# Patient Record
Sex: Female | Born: 1985
Health system: Southern US, Community
[De-identification: ages and names within clinical notes are randomized; demographics above are authoritative.]

## PROBLEM LIST (undated history)

## (undated) DIAGNOSIS — F419 Anxiety disorder, unspecified: Secondary | ICD-10-CM

## (undated) DIAGNOSIS — J302 Other seasonal allergic rhinitis: Secondary | ICD-10-CM

## (undated) DIAGNOSIS — E559 Vitamin D deficiency, unspecified: Secondary | ICD-10-CM

## (undated) DIAGNOSIS — J45909 Unspecified asthma, uncomplicated: Secondary | ICD-10-CM

## (undated) DIAGNOSIS — R12 Heartburn: Secondary | ICD-10-CM

## (undated) DIAGNOSIS — R7303 Prediabetes: Secondary | ICD-10-CM

## (undated) DIAGNOSIS — R030 Elevated blood-pressure reading, without diagnosis of hypertension: Secondary | ICD-10-CM

## (undated) DIAGNOSIS — F32A Depression, unspecified: Secondary | ICD-10-CM

## (undated) HISTORY — DX: Depression, unspecified: F32.A

## (undated) HISTORY — DX: Elevated blood-pressure reading, without diagnosis of hypertension: R03.0

## (undated) HISTORY — DX: Prediabetes: R73.03

## (undated) HISTORY — DX: Other seasonal allergic rhinitis: J30.2

## (undated) HISTORY — PX: WISDOM TOOTH EXTRACTION: SHX21

## (undated) HISTORY — DX: Unspecified asthma, uncomplicated: J45.909

## (undated) HISTORY — DX: Heartburn: R12

## (undated) HISTORY — DX: Vitamin D deficiency, unspecified: E55.9

## (undated) HISTORY — DX: Anxiety disorder, unspecified: F41.9

---

## 2012-05-26 ENCOUNTER — Emergency Department (HOSPITAL_BASED_OUTPATIENT_CLINIC_OR_DEPARTMENT_OTHER)
Admission: EM | Admit: 2012-05-26 | Discharge: 2012-05-27 | Disposition: A | Discharge status: Home / Self Care | Payer: Commercial Managed Care - PPO | Attending: Emergency Medicine | Admitting: Emergency Medicine

## 2012-05-26 ENCOUNTER — Encounter (HOSPITAL_BASED_OUTPATIENT_CLINIC_OR_DEPARTMENT_OTHER): Payer: Self-pay | Admitting: *Deleted

## 2012-05-26 DIAGNOSIS — R000 Tachycardia, unspecified: Secondary | ICD-10-CM | POA: Insufficient documentation

## 2012-05-26 DIAGNOSIS — R42 Dizziness and giddiness: Secondary | ICD-10-CM | POA: Insufficient documentation

## 2012-05-26 DIAGNOSIS — Z3202 Encounter for pregnancy test, result negative: Secondary | ICD-10-CM | POA: Insufficient documentation

## 2012-05-26 DIAGNOSIS — R197 Diarrhea, unspecified: Secondary | ICD-10-CM | POA: Insufficient documentation

## 2012-05-26 DIAGNOSIS — R110 Nausea: Secondary | ICD-10-CM | POA: Insufficient documentation

## 2012-05-26 LAB — PREGNANCY, URINE: Preg Test, Ur: NEGATIVE

## 2012-05-26 LAB — URINALYSIS, ROUTINE W REFLEX MICROSCOPIC
Glucose, UA: NEGATIVE mg/dL
Ketones, ur: NEGATIVE mg/dL
Leukocytes, UA: NEGATIVE
pH: 6.5 (ref 5.0–8.0)

## 2012-05-26 MED ORDER — SODIUM CHLORIDE 0.9 % IV BOLUS (SEPSIS)
1000.0000 mL | Freq: Once | INTRAVENOUS | Status: AC
  Administered 2012-05-26: 1000 mL via INTRAVENOUS

## 2012-05-26 NOTE — ED Notes (Signed)
MD at bedside. 

## 2012-05-26 NOTE — ED Notes (Addendum)
Pt sts she has not felt well x3 weeks. She sts that [facility] dx her with an URI. Pt sts that she has had intermittent HA's and tonight she felt as if her heart was beating out of her chest. PTAR reports pt denied any new meds or stimulants.

## 2012-05-26 NOTE — ED Provider Notes (Signed)
History     CSN: [id number]  Arrival date & time 05/26/12  2208   First MD Initiated Contact with Patient 05/26/12 2300      Chief Complaint  Patient presents with  . Palpitations    (Consider location/radiation/quality/duration/timing/severity/associated sxs/prior treatment) HPI Melissa Mason is a 27 y.o. female was not been feeling right for the last 2-3 weeks. I week ago she went to urgent care and was diagnosed with a URI, was given no medication. She's had some upper respiratory symptoms, has not taken anything for it besides ibuprofen, no decongestants. Patient is not taking any other medicine. No other medical history. Today the patient started having a fast heart rate, she said she was dizzy which felt like "floating" not like passing out. Patient had nausea. She's felt a fluttering in her chest has had a lump in her throat but has not had any difficulty breathing, no wheezing, no stridor. No chest pain, no shortness of breath, no rash, no myalgias or arthralgias. No fevers or chills. Symptoms have been moderate to severe, constant, if not had any alleviating or exacerbating factors and no other associated symptoms.  History reviewed. No pertinent past medical history.  Past Surgical History  Procedure Laterality Date  . Wisdom tooth extraction      No family history on file.  History  Substance Use Topics  . Smoking status: Never Smoker   . Smokeless tobacco: Not on file  . Alcohol Use: No    OB History   Grav Para Term Preterm Abortions TAB SAB Ect Mult Living                  Review of Systems At least 10pt or greater review of systems completed and are negative except where specified in the HPI.  Allergies  Review of patient's allergies indicates no known allergies.  Home Medications  No current outpatient prescriptions on file.  BP 155/95  Pulse 125  Temp(Src) 97.9 F (36.6 C) (Oral)  Resp 20  Ht 5\' 1"  (1.549 m)  Wt 220 lb (99.791 kg)  BMI 41.59  kg/m2  SpO2 100%  LMP 05/05/2012  Physical Exam  Nursing notes reviewed.  Electronic medical record reviewed. VITAL SIGNS:   Filed Vitals:   05/26/12 2218 05/26/12 2220 05/27/12 0001  BP:  155/95   Pulse:  125 91  Temp:  97.9 F (36.6 C)   TempSrc:  Oral   Resp:  20   Height:  5\' 1"  (1.549 m)   Weight:  220 lb (99.791 kg)   SpO2: 99% 100% 100%   CONSTITUTIONAL: Awake, oriented, appears non-toxic HENT: Atraumatic, normocephalic, oral mucosa pink and moist, airway patent. Nares patent without drainage. External ears normal. EYES: Conjunctiva clear, EOMI, PERRLA NECK: Trachea midline, non-tender, supple CARDIOVASCULAR: Normal heart rate, Normal rhythm, 2/6 systolic murmur left sternal border PULMONARY/CHEST: Clear to auscultation, no rhonchi, wheezes, or rales. Symmetrical breath sounds. Non-tender. ABDOMINAL: Non-distended, soft, non-tender - no rebound or guarding.  BS normal. NEUROLOGIC: Non-focal, moving all four extremities, no gross sensory or motor deficits. EXTREMITIES: No clubbing, cyanosis, or edema SKIN: Warm, Dry, No erythema, No rash  ED Course  Procedures (including critical care time)  Date: 05/27/2012  Rate: 117  Rhythm: Sinus tachycardia  QRS Axis: normal  Intervals: normal  ST/T Wave abnormalities: normal  Conduction Disutrbances: none  Narrative Interpretation: Sinus tachycardia - no prior EKG for comparison   Labs Reviewed  BASIC METABOLIC PANEL - Abnormal; Notable for the following:  Potassium 3.3 (*)    Glucose, Bld 112 (*)    All other components within normal limits  URINALYSIS, ROUTINE W REFLEX MICROSCOPIC  PREGNANCY, URINE  MAGNESIUM   No results found.   1. Tachycardia   2. Dizziness   3. Nausea   4. Diarrhea       MDM  Melissa Mason is a 27 y.o. female is of rapid heart rate, not feeling right x3 weeks, upper respiratory symptoms, diarrhea x2 episodes earlier today. Patient is in sinus tach, do not think this is SVT, will not  give adenosine, we'll give trial of normal saline IV.  Patient is not pregnant, electrolyte unremarkable.  After 2 L of normal saline, patient's heart rate is down in the 80s, regular rate and rhythm.  Patient does have a faint murmur at the left sternal border, however EKG shows no sign of HOCM, there are no deep, narrow Q waves in the lateral leads V5 to V6 1 and aVL or inferior leads 23 aVF, there are no large T-wave inversions in the lateral leads either.  Patient has no history of syncope on exertion. Based on the clinical presentation and response to fluids, and she is mildly dehydrated, she's had some diarrhea, she's also felt nauseous in the emergency department, given her some Zofran which she's felt much better with. It is possible she is on the cusp of developing a viral gastroenteritis, give her some Zofran for nausea treatment encouraged by mouth fluids.  She can follow up with primary care physician next week. Return to the ER for any worsening or concerning symptoms. Patient understands accepts medical plan as it's been dictated and her questions have been answered.          Jones Skene, MD 05/27/12 [phone]

## 2012-05-27 ENCOUNTER — Emergency Department (HOSPITAL_BASED_OUTPATIENT_CLINIC_OR_DEPARTMENT_OTHER): Payer: Commercial Managed Care - PPO

## 2012-05-27 ENCOUNTER — Encounter (HOSPITAL_BASED_OUTPATIENT_CLINIC_OR_DEPARTMENT_OTHER): Payer: Self-pay | Admitting: *Deleted

## 2012-05-27 ENCOUNTER — Emergency Department (HOSPITAL_BASED_OUTPATIENT_CLINIC_OR_DEPARTMENT_OTHER)
Admission: EM | Admit: 2012-05-27 | Discharge: 2012-05-28 | Disposition: A | Discharge status: Home / Self Care | Payer: Commercial Managed Care - PPO | Attending: Emergency Medicine | Admitting: Emergency Medicine

## 2012-05-27 DIAGNOSIS — E860 Dehydration: Secondary | ICD-10-CM | POA: Insufficient documentation

## 2012-05-27 DIAGNOSIS — Z3202 Encounter for pregnancy test, result negative: Secondary | ICD-10-CM | POA: Insufficient documentation

## 2012-05-27 DIAGNOSIS — R197 Diarrhea, unspecified: Secondary | ICD-10-CM | POA: Insufficient documentation

## 2012-05-27 DIAGNOSIS — R110 Nausea: Secondary | ICD-10-CM

## 2012-05-27 DIAGNOSIS — J45909 Unspecified asthma, uncomplicated: Secondary | ICD-10-CM | POA: Insufficient documentation

## 2012-05-27 HISTORY — DX: Unspecified asthma, uncomplicated: J45.909

## 2012-05-27 LAB — BASIC METABOLIC PANEL
BUN: 17 mg/dL (ref 6–23)
Chloride: 100 mEq/L (ref 96–112)
GFR calc Af Amer: >90 mL/min (ref >90)
Potassium: 3.3 mEq/L — ABNORMAL LOW (ref 3.5–5.1)
Sodium: 138 mEq/L (ref 135–145)

## 2012-05-27 LAB — MAGNESIUM: Magnesium: 2.1 mg/dL (ref 1.5–2.5)

## 2012-05-27 LAB — PREGNANCY, URINE: Preg Test, Ur: NEGATIVE

## 2012-05-27 MED ORDER — ONDANSETRON HCL 4 MG/2ML IJ SOLN
INTRAMUSCULAR | Status: AC
  Filled 2012-05-27: qty 2

## 2012-05-27 MED ORDER — ONDANSETRON 4 MG PO TBDP: 4.0000 mg | ORAL_TABLET | Freq: Three times a day (TID) | ORAL | Status: DC | PRN

## 2012-05-27 MED ORDER — ONDANSETRON 8 MG PO TBDP
ORAL_TABLET | ORAL | Status: AC
  Administered 2012-05-27: 8 mg via ORAL
  Filled 2012-05-27: qty 1

## 2012-05-27 MED ORDER — ONDANSETRON 8 MG PO TBDP: 8.0000 mg | ORAL_TABLET | Freq: Once | ORAL | Status: AC

## 2012-05-27 MED ORDER — ONDANSETRON HCL 4 MG/2ML IJ SOLN
4.0000 mg | Freq: Once | INTRAMUSCULAR | Status: AC
  Administered 2012-05-27: 4 mg via INTRAVENOUS

## 2012-05-27 MED ORDER — SODIUM CHLORIDE 0.9 % IV BOLUS (SEPSIS)
1000.0000 mL | Freq: Once | INTRAVENOUS | Status: AC
  Administered 2012-05-27: 1000 mL via INTRAVENOUS

## 2012-05-27 NOTE — ED Notes (Signed)
Pt reports she was seen here last and dx with dehydration- tonight feels shaky and nauseated

## 2012-05-27 NOTE — ED Notes (Signed)
MD at bedside. 

## 2012-05-28 ENCOUNTER — Encounter (HOSPITAL_BASED_OUTPATIENT_CLINIC_OR_DEPARTMENT_OTHER): Payer: Self-pay | Admitting: Emergency Medicine

## 2012-05-28 MED ORDER — ONDANSETRON 8 MG PO TBDP: ORAL_TABLET | ORAL | Status: DC

## 2012-05-28 NOTE — ED Provider Notes (Signed)
History     CSN: [id number]  Arrival date & time 05/27/12  2212   First MD Initiated Contact with Patient 05/27/12 2317      Chief Complaint  Patient presents with  . Shaking    (Consider location/radiation/quality/duration/timing/severity/associated sxs/prior treatment) The history is provided by the patient. No language interpreter was used.  Patient seen within 24 hours for nausea and diarrhea and dehydration.  No emesis today some nausea following eating a healthy choice dinner. 2 episodes of diarrhea. But felt tremulous and came to the ED for further eval.  No fevers, no cough, no abdominal pain.  No sore throat.  No urinary symptoms.  Is holding down liquids.    Past Medical History  Diagnosis Date  . Asthma     Past Surgical History  Procedure Laterality Date  . Wisdom tooth extraction      No family history on file.  History  Substance Use Topics  . Smoking status: Never Smoker   . Smokeless tobacco: Never Used  . Alcohol Use: Yes     Comment: rare    OB History   Grav Para Term Preterm Abortions TAB SAB Ect Mult Living                  Review of Systems  Constitutional: Negative for fever.  HENT: Negative for sore throat.   Respiratory: Negative for cough.   Gastrointestinal: Positive for diarrhea. Negative for vomiting and abdominal pain.  Genitourinary: Negative for dysuria and flank pain.  All other systems reviewed and are negative.    Allergies  Review of patient's allergies indicates no known allergies.  Home Medications   Current Outpatient Rx  Name  Route  Sig  Dispense  Refill  . ondansetron (ZOFRAN ODT) 4 MG disintegrating tablet   Oral   Take 1 tablet (4 mg total) by mouth every 8 (eight) hours as needed for nausea.   10 tablet   0     BP 138/94  Pulse 92  Temp(Src) 98 F (36.7 C) (Oral)  Resp 20  Ht 5\' 1"  (1.549 m)  Wt 220 lb (99.791 kg)  BMI 41.59 kg/m2  SpO2 100%  LMP 05/05/2012  Physical Exam  Constitutional: She  is oriented to person, place, and time. She appears well-developed and well-nourished. No distress.  HENT:  Head: Normocephalic and atraumatic.  Mouth/Throat: Oropharynx is clear and moist.  Eyes: Conjunctivae are normal. Pupils are equal, round, and reactive to light.  Neck: Normal range of motion. Neck supple.  Cardiovascular: Normal rate, regular rhythm and intact distal pulses.   Pulmonary/Chest: Effort normal and breath sounds normal. She has no wheezes. She has no rales.  Abdominal: Soft. Bowel sounds are normal. There is no tenderness. There is no rebound and no guarding.  Musculoskeletal: Normal range of motion.  Lymphadenopathy:    She has no cervical adenopathy.  Neurological: She is alert and oriented to person, place, and time.  Skin: Skin is warm and dry.  Psychiatric: Thought content normal.    ED Course  Procedures (including critical care time)  Labs Reviewed  PREGNANCY, URINE   No results found.   No diagnosis found.    MDM  Suspect anxiety and nausea secondary to eating a healthy choice dinner.  PO challenged successfully in the ED. No indication for labs.  Imaging exam and vitals reassuring.  Follow up with your family doctor for ongoing care,  Parke Simmers diet instructions given  Jasmine Awe, MD 05/28/12 [phone]

## 2013-06-17 ENCOUNTER — Emergency Department (HOSPITAL_BASED_OUTPATIENT_CLINIC_OR_DEPARTMENT_OTHER)
Admission: EM | Admit: 2013-06-17 | Discharge: 2013-06-18 | Disposition: A | Discharge status: Home / Self Care | Payer: Commercial Managed Care - PPO | Attending: Emergency Medicine | Admitting: Emergency Medicine

## 2013-06-17 ENCOUNTER — Encounter (HOSPITAL_BASED_OUTPATIENT_CLINIC_OR_DEPARTMENT_OTHER): Payer: Self-pay | Admitting: Emergency Medicine

## 2013-06-17 DIAGNOSIS — F410 Panic disorder [episodic paroxysmal anxiety] without agoraphobia: Secondary | ICD-10-CM | POA: Insufficient documentation

## 2013-06-17 DIAGNOSIS — R002 Palpitations: Secondary | ICD-10-CM | POA: Insufficient documentation

## 2013-06-17 DIAGNOSIS — J45909 Unspecified asthma, uncomplicated: Secondary | ICD-10-CM | POA: Insufficient documentation

## 2013-06-17 NOTE — ED Notes (Signed)
States she has been feeling anxious and heart racing x 2 hours

## 2013-06-17 NOTE — ED Provider Notes (Signed)
CSN: [id number]     Arrival date & time 06/17/13  2252 History  This chart was scribed for Yaris Ferrell Alfonso Patten, MD by Roe Coombs, ED Scribe. The patient was seen in room [place]. Patient's care was started at 11:57 PM.  Chief Complaint  Patient presents with  . Panic Attack     Patient is a 28 y.o. female presenting with anxiety. The history is provided by the patient. No language interpreter was used.  Anxiety This is a recurrent problem. The current episode started 1 to 2 hours ago. The problem occurs constantly. The problem has been rapidly improving. Pertinent negatives include no chest pain, no abdominal pain, no headaches and no shortness of breath. Nothing aggravates the symptoms. Nothing relieves the symptoms. She has tried nothing for the symptoms. The treatment provided significant relief.    HPI Comments: Melissa Mason is a 28 y.o. female with history of anxiety who presents to the Emergency Department complaining of a panic attack that has rapidly improved since onset. Patient states that 2 hours ago, she began to feel anxious and developed palpitations. She says that she was not doing any stressful activities prior to episode onset. Patient further reports that she has been working her PCP to identify causes or triggers of these episodes. She has also had increased blood pressure with her panic episodes so she is currently on a low dose of metoprolol. She has not seen a therapist for this problem. She denies any other symptoms at this time. She has a medical history of asthma.   Past Medical History  Diagnosis Date  . Asthma    Past Surgical History  Procedure Laterality Date  . Wisdom tooth extraction     No family history on file. History  Substance Use Topics  . Smoking status: Never Smoker   . Smokeless tobacco: Never Used  . Alcohol Use: Yes     Comment: rare   OB History   Grav Para Term Preterm Abortions TAB SAB Ect Mult Living                 Review of  Systems  Constitutional: Negative for fever.  Respiratory: Negative for shortness of breath.   Cardiovascular: Positive for palpitations. Negative for chest pain.  Gastrointestinal: Negative for abdominal pain.  Neurological: Negative for headaches.  All other systems reviewed and are negative.   Allergies  Review of patient's allergies indicates no known allergies.  Home Medications   Current Outpatient Rx  Name  Route  Sig  Dispense  Refill  . ALPRAZolam (XANAX) 0.25 MG tablet   Oral   Take 0.25 mg by mouth at bedtime as needed for anxiety.         . metoprolol tartrate (LOPRESSOR) 25 MG tablet   Oral   Take 25 mg by mouth at bedtime as needed.         . ondansetron (ZOFRAN ODT) 4 MG disintegrating tablet   Oral   Take 1 tablet (4 mg total) by mouth every 8 (eight) hours as needed for nausea.   10 tablet   0   . ondansetron (ZOFRAN ODT) 8 MG disintegrating tablet      8mg  ODT q8 hours prn nausea   4 tablet   0    Triage Vitals: BP 142/84  Pulse 97  Temp(Src) 98.3 F (36.8 C) (Oral)  Resp 20  Ht 5\' 1"  (1.549 m)  Wt 225 lb (102.059 kg)  BMI 42.54 kg/m2  SpO2 100%  LMP 05/27/2013 Physical Exam  Constitutional: She is oriented to person, place, and time. She appears well-developed and well-nourished. No distress.  HENT:  Head: Normocephalic and atraumatic.  Mouth/Throat: Oropharynx is clear and moist. No oropharyngeal exudate.  Moist mucous membranes.  Eyes: EOM are normal. Pupils are equal, round, and reactive to light.  Neck: Normal range of motion. Neck supple. No tracheal deviation present.  Cardiovascular: Normal rate, regular rhythm and normal heart sounds.  Exam reveals no gallop and no friction rub.   No murmur heard. Pulmonary/Chest: Effort normal and breath sounds normal. No respiratory distress. She has no wheezes. She has no rales.  Abdominal: Soft. Bowel sounds are normal. There is no tenderness. There is no rebound and no guarding.   Musculoskeletal: Normal range of motion.  Neurological: She is alert and oriented to person, place, and time. She has normal reflexes. She displays normal reflexes.  Skin: Skin is warm and dry.  Psychiatric: Her mood appears anxious.    ED Course  Procedures (including critical care time) DIAGNOSTIC STUDIES: Oxygen Saturation is 100% on room air, normal by my interpretation.    COORDINATION OF CARE: 12:02 AM- Recommended that patient continue to follow up with her PCP and seek referral for counseling to identify cause of panic episodes. Patient informed of current plan for treatment and evaluation and agrees with plan at this time.    MDM   Final diagnoses:  None   Feels improved and does not want meds at this time.  Will give outpatient resources for therapy.    I personally performed the services described in this documentation, which was scribed in my presence. The recorded information has been reviewed and is accurate.     Carlisle Beers, MD 06/18/13 [phone]

## 2013-06-18 ENCOUNTER — Encounter (HOSPITAL_BASED_OUTPATIENT_CLINIC_OR_DEPARTMENT_OTHER): Payer: Self-pay | Admitting: Emergency Medicine

## 2013-06-18 NOTE — Discharge Instructions (Signed)
Panic Attacks Panic attacks are sudden, short-livedsurges of severe anxiety, fear, or discomfort. They [place] occur for no reason when you are relaxed, when you are anxious, or when you are sleeping. Panic attacks Salomon occur for a number of reasons:   Healthy people occasionally have panic attacks in extreme, life-threatening situations, such as war or natural disasters. Normal anxiety is a protective mechanism of the body that helps [place] react to danger (fight or flight response).  Panic attacks are often seen with anxiety disorders, such as panic disorder, social anxiety disorder, generalized anxiety disorder, and phobias. Anxiety disorders cause excessive or uncontrollable anxiety. They [place] interfere with your relationships or other life activities.  Panic attacks are sometimes seen with other mental illnesses such as depression and posttraumatic stress disorder.  Certain medical conditions, prescription medicines, and drugs of abuse can cause panic attacks. SYMPTOMS  Panic attacks start suddenly, peak within 20 minutes, and are accompanied by four or more of the following symptoms:  Pounding heart or fast heart rate (palpitations).  Sweating.  Trembling or shaking.  Shortness of breath or feeling smothered.  Feeling choked.  Chest pain or discomfort.  Nausea or strange feeling in your stomach.  Dizziness, lightheadedness, or feeling like you will faint.  Chills or hot flushes.  Numbness or tingling in your lips or hands and feet.  Feeling that things are not real or feeling that you are not yourself.  Fear of losing control or going crazy.  Fear of dying. Some of these symptoms can mimic serious medical conditions. For example, you Cortez think you are having a heart attack. Although panic attacks can be very scary, they are not life threatening. DIAGNOSIS  Panic attacks are diagnosed through an assessment by your health care provider. Your health care provider will ask questions  about your symptoms, such as where and when they occurred. Your health care provider will also ask about your medical history and use of alcohol and drugs, including prescription medicines. Your health care provider Dedrick order blood tests or other studies to rule out a serious medical condition. Your health care provider Allard refer you to a mental health professional for further evaluation. TREATMENT   Most healthy people who have one or two panic attacks in an extreme, life-threatening situation will not require treatment.  The treatment for panic attacks associated with anxiety disorders or other mental illness typically involves counseling with a mental health professional, medicine, or a combination of both. Your health care provider will help determine what treatment is best for you.  Panic attacks due to physical illness usually goes away with treatment of the illness. If prescription medicine is causing panic attacks, talk with your health care provider about stopping the medicine, decreasing the dose, or substituting another medicine.  Panic attacks due to alcohol or drug abuse goes away with abstinence. Some adults need professional help in order to stop drinking or using drugs. HOME CARE INSTRUCTIONS   Take all your medicines as prescribed.   Check with your health care provider before starting new prescription or over-the-counter medicines.  Keep all follow up appointments with your health care provider. SEEK MEDICAL CARE IF:  You are not able to take your medicines as prescribed.  Your symptoms do not improve or get worse. SEEK IMMEDIATE MEDICAL CARE IF:   You experience panic attack symptoms that are different than your usual symptoms.  You have serious thoughts about hurting yourself or others.  You are taking medicine for panic attacks and  have a serious side effect. MAKE SURE YOU:  Understand these instructions.  Will watch your condition.  Will get help right away  if you are not doing well or get worse. Document Released: 02/23/2005 Document Revised: 12/14/2012 Document Reviewed: 10/07/2012 [facility] Patient Information 2014 [place].  Emergency Department Resource Guide 1) Find a Doctor and Pay Out of Pocket Although you won't have to find out who is covered by your insurance plan, it is a good idea to ask around and get recommendations. You will then need to call the office and see if the doctor you have chosen will accept you as a new patient and what types of options they offer for patients who are self-pay. Some doctors offer discounts or will set up payment plans for their patients who do not have insurance, but you will need to ask so you aren't surprised when you get to your appointment.  2) Contact Your Local Health Department Not all health departments have doctors that can see patients for sick visits, but many do, so it is worth a call to see if yours does. If you don't know where your local health department is, you can check in your phone book. The CDC also has a tool to help you locate your state's health department, and many state websites also have listings of all of their local health departments.  3) Find a [facility] If your illness is not likely to be very severe or complicated, you may want to try a walk in clinic. These are popping up all over the country in pharmacies, drugstores, and shopping centers. They're usually staffed by nurse practitioners or physician assistants that have been trained to treat common illnesses and complaints. They're usually fairly quick and inexpensive. However, if you have serious medical issues or chronic medical problems, these are probably not your best option.  No Primary Care Doctor: - Call Health Connect at  [phone] - they can help you locate a primary care doctor that  accepts your insurance, provides certain services, etc. - Physician Referral Service- [phone]  Chronic Pain  Problems: Organization         Address  Phone   Notes  [facility]  [phone] Patients need to be referred by their primary care doctor.   Medication Assistance: Organization         Address  Phone   Notes  [facility] Medication [facility] [address] [place], [place] [address] [phone] --Must be a resident of [facility] -- Must have NO insurance coverage whatsoever (no Medicaid/ Medicare, etc.) -- The pt. MUST have a primary care doctor that directs their care regularly and follows them in the community   MedAssist  [phone]   [employer]  [phone]    Agencies that provide inexpensive medical care: Organization         Address  Phone   Notes  [place]  [phone]   [facility]    [phone]   [facility] [place], [place] [address] [phone]   [place] [address] [place] [phone]   Planned Parenthood    [phone]   [facility]    [phone]   [place] and [address], [place] Phone:  [phone], Fax:  [phone] Hours of Operation:  9 am - 6  pm, M-F.  Also accepts Medicaid/Medicare and self-pay.  [facility] for [address]ola [address], [address], [place] Phone: [phone], Fax: [phone]. Hours of Operation:  8:30 am - 5:30 pm, M-F.  Also accepts Medicaid and self-pay.  [place] [address], [place] Phone: [phone]   [place], [place], [place] [phone], Ext. 123 Mondays & Thursdays: 7-9 AM.  First 15 patients are seen on a first come, first serve basis.    [place]  Providers:  Organization         Address  Phone   Notes  [facility] [address], [place] [phone] Also  accepts self-pay patients.  [facility] [address], [place]  [phone]   [place], [address], [place] [phone]   [facility] [address] [place] [phone]   Lucianne Lei [address], [place]   [phone] Only accepts [place] Access [place] patients after they have their name applied to their card.   Self-Pay (no insurance) in [facility]:  Organization         Address  Phone   Notes  Sickle Cell Patients, [facility] [place] [phone]   [facility] [place] [phone]   [facility] [place]  [place], [place], [place] [phone]   [facility]/Dr. Osei-Bonsu  [address] [place] or [address], [place] [phone] Phone number for both [place] and [place] locations is the same.  Urgent Medical and [facility] [address] [place] [phone]   [facility] [address], [place] or [address] [phone]   [facility] [address], [place] [phone], phone; [phone], fax Sees patients 1st and 3rd Saturday of every month.  Must not qualify for public or private insurance (i.e. Medicaid, Medicare, [place] Health Choice, Veterans' Benefits)  Household income should be no more than 200% of the poverty level The clinic cannot treat you if you are pregnant or think you are pregnant  Sexually transmitted diseases are not treated at the clinic.    Dental Care: Organization         Address  Phone  Notes  [facility] of [facility] [place] [phone] Accepts children up to age 26 who are enrolled in [place] or [place]; pregnant  women with a Medicaid card; and children who have applied for Medicaid or [place] Health Choice, but were declined, whose parents can pay a reduced fee at time of service.  [facility] Department of [facility]  [address], [place] [phone] Accepts children up to age 35 who are enrolled in [place] or [place]; pregnant women with a Medicaid card; and children who have applied for Medicaid or [place] Health Choice, but were declined, whose parents can pay a reduced fee at time of service.  [place] Adult Dental Access PROGRAM  [place] [phone] Patients are seen by appointment only. Walk-ins are not accepted. [place] will see patients 48 years of age and older.  Monday - Tuesday (8am-5pm) Most Wednesdays (8:30-5pm) $30 per visit, cash only  [facility] Adult Dental Access PROGRAM  [address], [facility] [phone] Patients are seen by appointment only. Walk-ins are not accepted. [facility] will see patients 69 years of age and older. One Wednesday Evening (Monthly: Volunteer Based).  $30 per visit, cash only  [employer]  [phone] for adults; Children under age 68, call Graduate Pediatric Dentistry at [phone]. Children aged 25-14, please call [phone] to request a pediatric application.  Dental services are provided in all areas of dental care including fillings, crowns and bridges, complete and partial dentures, implants, gum treatment, root canals, and extractions. Preventive care is also provided. Treatment is provided to both adults and children. Patients are selected via a lottery and there is often a waiting list.   [facility] [address] [place]  [phone] [email or web address]   [facility] [address] [place], [place] [phone], Ext. 123 Second and Fourth Thursday of each month, opens at 6:30 AM; Clinic ends at 9 AM.  Patients are  seen on a first-come first-served basis, and a limited number are seen during each clinic.   [facility]  [address] Ether Griffins [place], [place] [phone]   Eligibility Requirements You must have lived in [place], [place], or [place] counties for at least the last three months.   You cannot be eligible for state or federal sponsored [place], including [employer], [place], or [employer].   You generally cannot be eligible for healthcare insurance through your employer.    How to apply: Eligibility screenings are held every Tuesday and Wednesday afternoon from 1:00 pm until 4:00 pm. You do not need an appointment for the interview!  [facility] [address], [place], [place] [phone]   [facility] Health Department  [phone]   [facility] Health Department  [phone]   [facility] Health Department  [phone]    Behavioral Health Resources in the Community: Intensive Outpatient Programs Organization         Address  Phone  Notes  [facility] [address] [place], [place] [phone]   [facility] Outpatient [address] [place], [place] [phone]   ADS: Alcohol & Drug Svcs [address] [place], [place]  [phone]   [facility] Mental Health [address]  [place], [place] [phone] or [phone]   Substance Abuse Resources Organization         Address  Phone  Notes  Alcohol and Drug Services  [phone]   [facility]  [phone]   [facility] [place]  [phone]   Floydene Flock  [phone]   Residential & Outpatient Substance Abuse Program  ([phone]   Psychological Services Organization         Address  Phone  Notes  [facility] Behavioral Health  [phone]   [facility] Services  [phone]   [facility] Mental Health [address], [place] [phone] or [phone]    Mobile Crisis  Teams Organization         Address  Phone  Notes  [facility]atives, Mobile Crisis Care Unit  [phone]   [facility]  [address] [place], [place] [phone]   Doristine Locks [address] [place] [phone]    Self-Help/Support Groups Organization         Address  Phone  Notes  Mental Health Assoc. of [place] - variety of support groups  [phone] Call for more information  Narcotics Anonymous (NA), Caring Services [address], [place]  2 meetings at this location   Statisticianesidential Treatment Programs Organization         Address  Phone  Notes  [facility] [address],    [place]  [phone]   [facility]  [address], [place] [phone], [place], [place] [phone]   [facility] [address], [place] [phone] Admissions: 8am-3pm M-F  Incentives Substance Abuse Treatment Center [address]    [place], [place] [phone]   [facility] [address], [place], [place] [phone]   [facility] [address]  [place], [place] [phone]   [facility] - Intensive Outpatient [address], [place], [place] [phone]   [facility] (Addiction Recovery Care Assoc.) [address]  [place], [place] [phone] or [phone]   Residential Treatment Services (RTS) [address] [place], [place] [phone] Accepts Medicaid  [facility] [place] [address]  [place] [phone] Substance Abuse/Addiction Treatment   [facility] Behavioral Health Resources Organization         Address  Phone  Notes  [facility]  [phone]) [phone]   Angie FavaJulie Brannon, PhD [address], [address] [place], [place]   [phone](336) [phone] or [phone](336) [phone]   [facility] [place]   [address] [place], [place] [phone]) [phone]   [facility] [address], [place], [place] [phone](336) [phone]  Insurance/Medicaid/sponsorship through [facility]  Faith and Families [address]                                    [place], [place] [phone]) [phone] Therapy/tele-psych/case  [address] [facility] [address]   [place], [place] [phone]) [phone]    Dr. Lolly MustacheArfeen  [phone](336) [phone]   [facility] [place]  [employer] [facility] 1) [address], [place] 2) [address], [place] 3)  [address], [place] [phone]) [phone](336) [phone]  [phone]) [phone]   [facility] Child Abuse Hotline (617)[phone](336) (601) [phone] or [phone](336) [phone] (After Hours)     Behavioral Health Resources in the [facility]  Intensive Outpatient Programs: [facility]      [address] [place], [place] [phone] Both a day and evening program       [facility] Outpatient     [address]        [place], [place] [phone] [phone]         ADS: Alcohol & Drug Svcs [address] [place] [phone]  [facility] Mental [facility] ACCESS LINE: [phone] or [phone] [address] [place], [place] [phone] [email or web address]  Mobile Crisis Teams:                                        [facility]rapeutic Alternatives         Mobile Crisis Care Unit [phone]             [facility] Psych[facility] Services [address] [place] [phone]  [facility] [address] [place] [phone]  Self-Help/Support Groups: Mental Health Assoc. of [employer] of support groups [phone] (call for more info)  Narcotics Anonymous (NA) Caring Services [address] [place] [address] [place] - 2 meetings at this location  Residential Treatment Programs:  [place]       [place] [address] [place], [place]  [address] [place]  [address] [place], [place] [address] [phone] Admissions: 8am-3pm M-F  Incentives Substance [place]     [address], [place] [address]       [phone]         [facility] [address] Jadene Pierini [place], [place]  [facility] [address] [place], [place]  [facility]ight Programs - Intensive Outpatient      [address] Mabel 403     [place], [place]         [facility] ([place]     [place], [place] or [phone]  Residential Treatment Services (RTS)  [place], [place]  Fellowship [address]                                               [place]  [facility] Resources: St. [phone]               General Therapy                                                Domenic Schwab, PhD        [address] [place], [place] [address]         [facility]   [address] [place], [place] [address] [phone]  [facility] [address] [place], [place] [address] [phone] Insurance/Medicaid/sponsorship through [facility] and Families                                              Martell                                        [place], [place] [address]    Therapy/tele-psych/case         [place], [place]  [phone]  Adolescent/group home/case management  Hyndman PhD       General therapy       Insurance   [phone]         Dr. Adele Schilder Insurance [phone] M-F  [place] Detox/Residential Medicaid, sponsorship [phone]

## 2013-06-18 NOTE — ED Notes (Addendum)
Pt d/c home with friends- no Rx given- pt states sx have resolved

## 2013-08-07 IMAGING — CR DG ABDOMEN ACUTE W/ 1V CHEST
3 series · 3 of 3 positions shown · non-contrast
Comparison: None

CLINICAL DATA: Abdominal pain, nausea and diarrhea.

ACUTE ABDOMEN SERIES (ABDOMEN 2 VIEW & CHEST 1 VIEW)

[w chest pa]
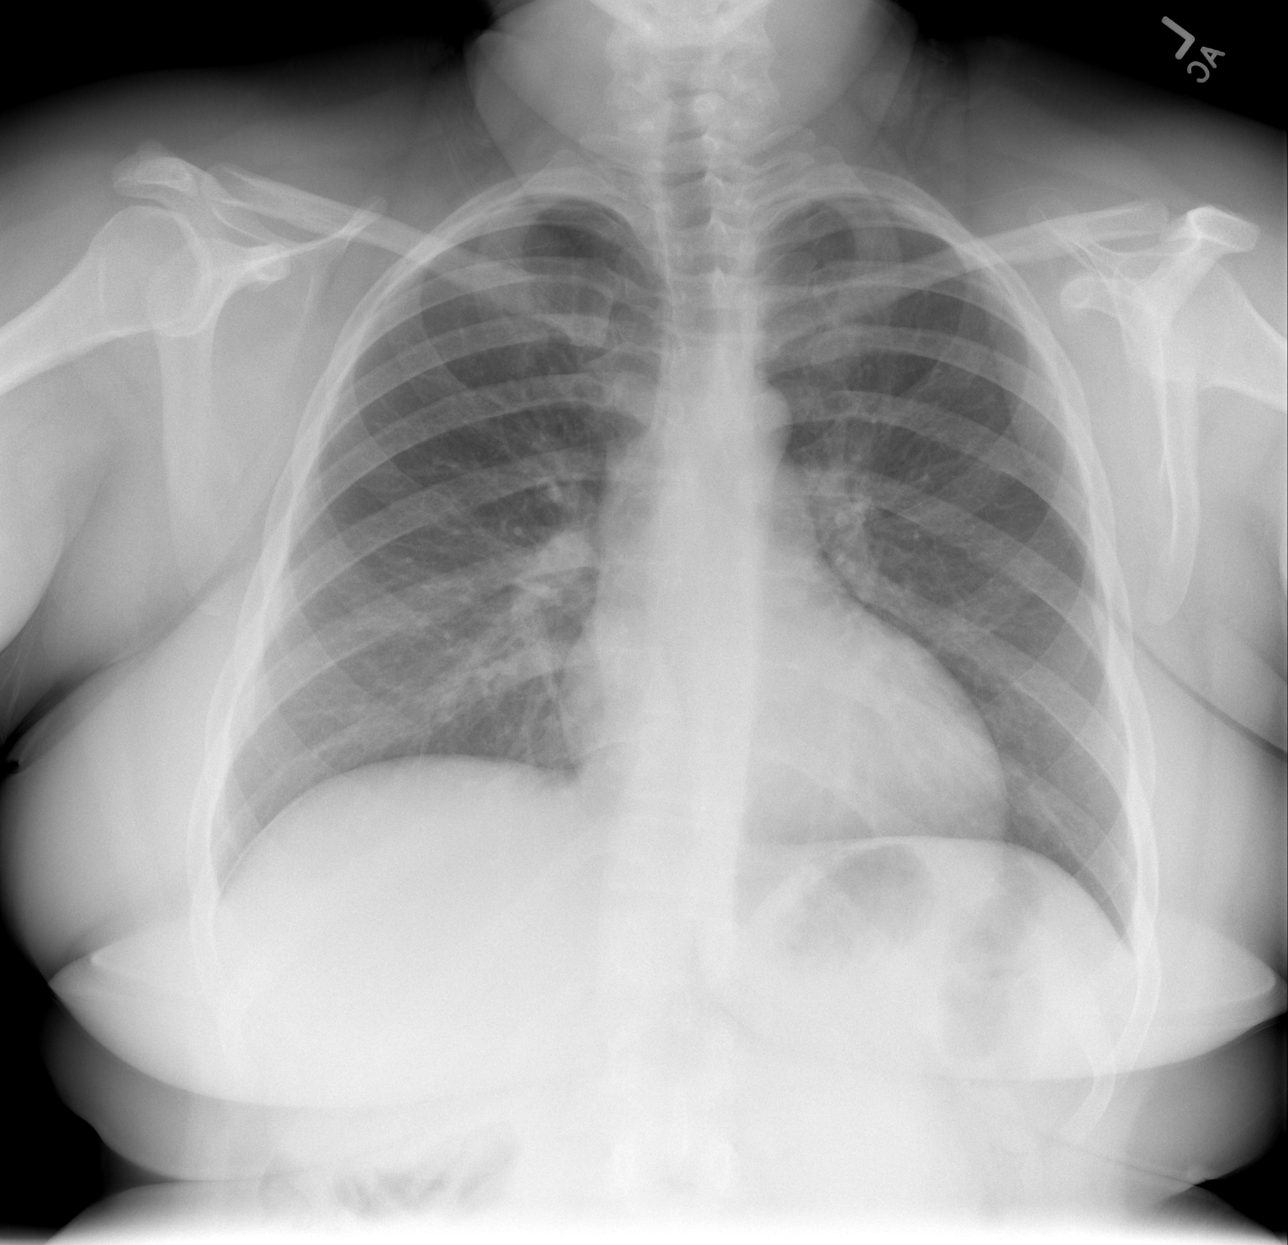

[w abdomen upright]
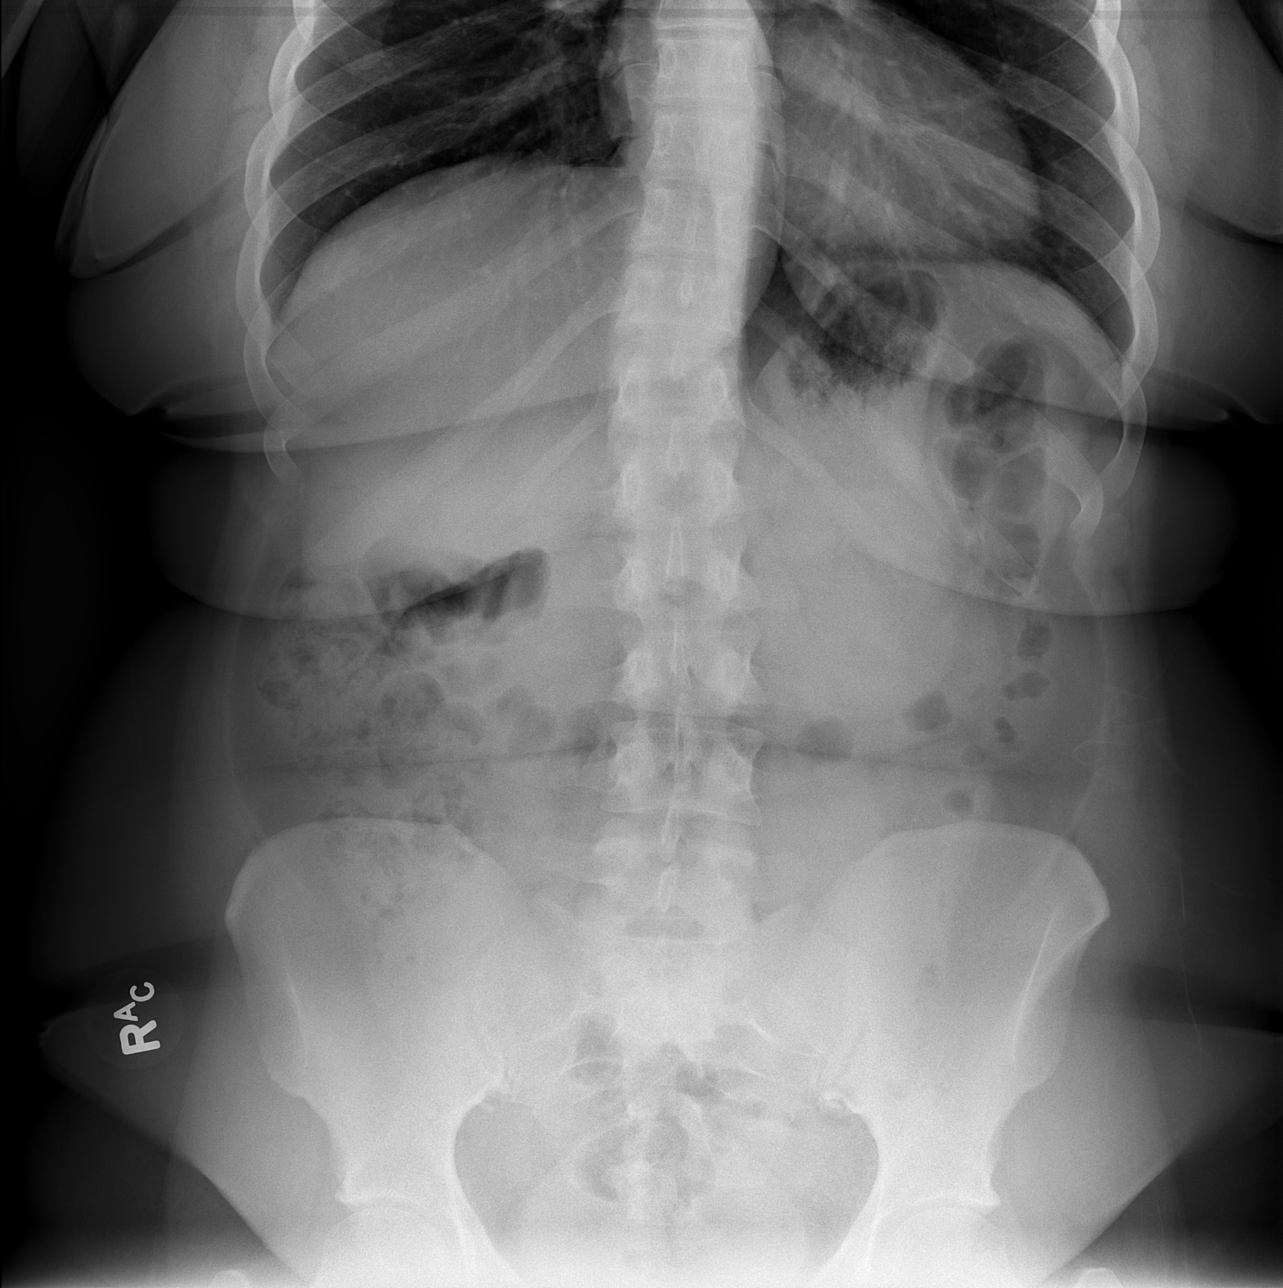

[t abdomen supine]
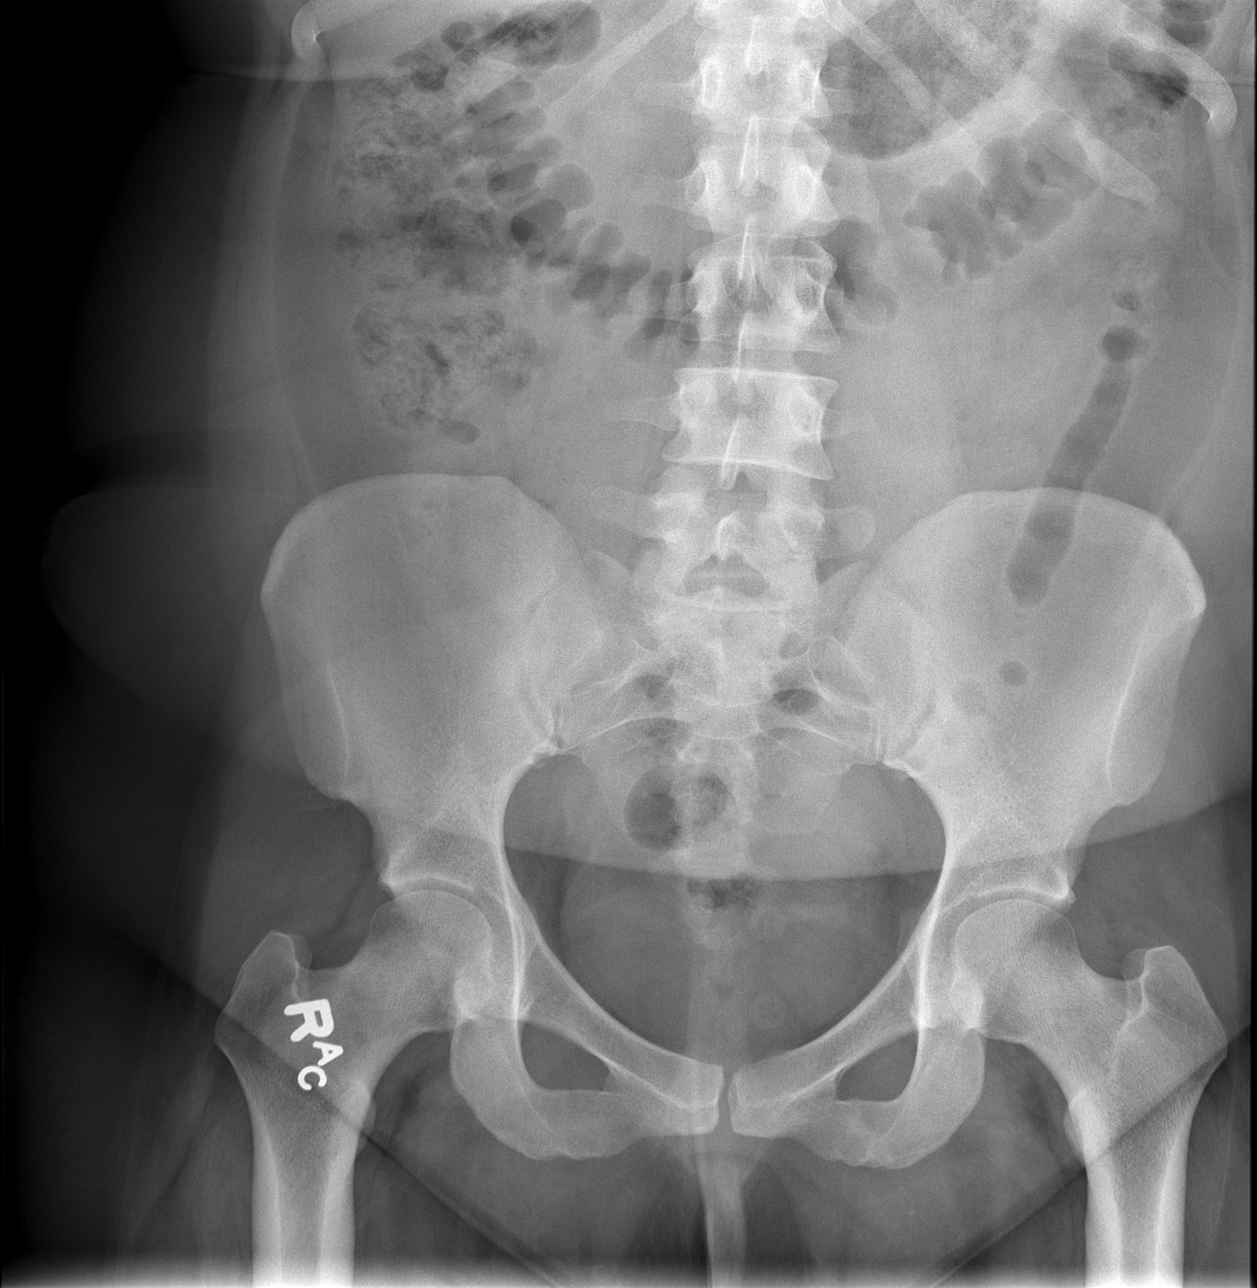

[3 of 3 positions shown; findings below may reference images not displayed]

FINDINGS: The cardiomediastinal silhouette is unremarkable.
The lungs are clear.
There is no evidence of airspace disease, pleural effusion or
pneumothorax.

The bowel gas pattern is unremarkable.
Gas and stool in the colon noted.
No dilated bowel loops are present.
There is no evidence of pneumoperitoneum or suspicious
calcifications.
IMPRESSION: Unremarkable exam.

## 2015-08-16 MED FILL — ESCITALOPRAM 20 MG TABLET: 20 | 30 days supply | Qty: 30 | Fill #0

## 2015-08-16 MED FILL — METOPROLOL SUCC ER 25 MG TA: 25 | 60 days supply | Qty: 60 | Fill #0

## 2015-08-16 MED FILL — VIT D2 1.25 MG (50,000 UNIT: 1.25 MG | 28 days supply | Qty: 4 | Fill #0

## 2015-09-16 DIAGNOSIS — E559 Vitamin D deficiency, unspecified: Secondary | ICD-10-CM | POA: Diagnosis not present

## 2015-09-16 DIAGNOSIS — I10 Essential (primary) hypertension: Secondary | ICD-10-CM | POA: Diagnosis not present

## 2015-10-22 MED FILL — METOPROLOL SUCC ER 25 MG TA: 25 | 60 days supply | Qty: 60 | Fill #1

## 2015-10-22 MED FILL — ESCITALOPRAM 20 MG TABLET: 20 | 30 days supply | Qty: 30 | Fill #1

## 2015-11-08 DIAGNOSIS — F411 Generalized anxiety disorder: Secondary | ICD-10-CM | POA: Diagnosis not present

## 2015-11-08 DIAGNOSIS — Z6841 Body Mass Index (BMI) 40.0 and over, adult: Secondary | ICD-10-CM | POA: Diagnosis not present

## 2015-12-16 MED FILL — ESCITALOPRAM 20 MG TABLET: 20 | 30 days supply | Qty: 30 | Fill #2

## 2016-01-03 MED FILL — METOPROLOL SUCC ER 25 MG TA: 25 | 60 days supply | Qty: 60 | Fill #0

## 2016-01-09 MED FILL — ESCITALOPRAM 20 MG TABLET: 20 | 60 days supply | Qty: 60 | Fill #0

## 2016-03-16 DIAGNOSIS — Z6841 Body Mass Index (BMI) 40.0 and over, adult: Secondary | ICD-10-CM | POA: Diagnosis not present

## 2016-03-16 DIAGNOSIS — H6691 Otitis media, unspecified, right ear: Secondary | ICD-10-CM | POA: Diagnosis not present

## 2016-03-16 DIAGNOSIS — R062 Wheezing: Secondary | ICD-10-CM | POA: Diagnosis not present

## 2016-03-16 DIAGNOSIS — F411 Generalized anxiety disorder: Secondary | ICD-10-CM | POA: Diagnosis not present

## 2016-03-16 DIAGNOSIS — R05 Cough: Secondary | ICD-10-CM | POA: Diagnosis not present

## 2016-03-16 MED FILL — AMOXICILLIN 500 MG CAPSULE: 500 | 10 days supply | Qty: 20 | Fill #0

## 2016-03-16 MED FILL — METOPROLOL SUCC ER 25 MG TA: 25 | 30 days supply | Qty: 30 | Fill #0

## 2016-03-16 MED FILL — HYDROCODONE-HOMATROPINE SYR: 5-1.5 | 5 days supply | Qty: 50 | Fill #0

## 2016-03-16 MED FILL — ESCITALOPRAM 20 MG TABLET: 20 | 30 days supply | Qty: 30 | Fill #0

## 2016-03-16 MED FILL — VENTOLIN HFA 90 MCG INHALER: 108 (90 BAS | 30 days supply | Qty: 18 | Fill #0

## 2016-04-27 MED FILL — ESCITALOPRAM 20 MG TABLET: 20 | 30 days supply | Qty: 30 | Fill #1

## 2016-04-27 MED FILL — METOPROLOL SUCC ER 25 MG TA: 25 | 30 days supply | Qty: 30 | Fill #1

## 2016-05-05 DIAGNOSIS — J018 Other acute sinusitis: Secondary | ICD-10-CM | POA: Diagnosis not present

## 2016-05-05 MED FILL — FLUTICASONE PROP 50 MCG SPR: 50 | 30 days supply | Qty: 16 | Fill #0

## 2016-05-05 MED FILL — DOXYCYCLINE HYC 100 MG CAP: 100 | 7 days supply | Qty: 14 | Fill #0

## 2016-06-10 MED FILL — ESCITALOPRAM 20 MG TABLET: 20 | 30 days supply | Qty: 30 | Fill #2

## 2016-06-10 MED FILL — METOPROLOL SUCC ER 25 MG TA: 25 | 30 days supply | Qty: 30 | Fill #2

## 2016-07-23 MED FILL — ESCITALOPRAM 20 MG TABLET: 20 | 30 days supply | Qty: 30 | Fill #0

## 2016-07-23 MED FILL — METOPROLOL SUCC ER 25 MG TA: 25 | 30 days supply | Qty: 30 | Fill #3

## 2016-09-07 MED FILL — METOPROLOL SUCC ER 25 MG TA: 25 | 30 days supply | Qty: 30 | Fill #4

## 2016-10-27 DIAGNOSIS — Z6841 Body Mass Index (BMI) 40.0 and over, adult: Secondary | ICD-10-CM | POA: Diagnosis not present

## 2016-10-27 DIAGNOSIS — R009 Unspecified abnormalities of heart beat: Secondary | ICD-10-CM | POA: Diagnosis not present

## 2016-10-27 DIAGNOSIS — I10 Essential (primary) hypertension: Secondary | ICD-10-CM | POA: Diagnosis not present

## 2016-10-27 DIAGNOSIS — F411 Generalized anxiety disorder: Secondary | ICD-10-CM | POA: Diagnosis not present

## 2016-10-27 DIAGNOSIS — R05 Cough: Secondary | ICD-10-CM | POA: Diagnosis not present

## 2016-10-27 MED FILL — BENZONATATE 200 MG CAPSULE: 200 | 10 days supply | Qty: 30 | Fill #0

## 2016-10-27 MED FILL — METOPROLOL SUCC ER 25 MG TA: 25 | 30 days supply | Qty: 30 | Fill #0

## 2016-10-27 MED FILL — ESCITALOPRAM 20 MG TABLET: 20 | 30 days supply | Qty: 30 | Fill #0

## 2016-12-17 ENCOUNTER — Telehealth: Payer: Self-pay | Admitting: *Deleted

## 2016-12-17 NOTE — Telephone Encounter (Signed)
Entered in error

## 2016-12-22 MED FILL — ESCITALOPRAM 20 MG TABLET: 20 | 30 days supply | Qty: 30 | Fill #1

## 2016-12-22 MED FILL — METOPROLOL SUCC ER 25 MG TA: 25 | 30 days supply | Qty: 30 | Fill #1

## 2017-02-01 DIAGNOSIS — E559 Vitamin D deficiency, unspecified: Secondary | ICD-10-CM | POA: Diagnosis not present

## 2017-02-01 DIAGNOSIS — Z0000 Encounter for general adult medical examination without abnormal findings: Secondary | ICD-10-CM | POA: Diagnosis not present

## 2017-02-01 DIAGNOSIS — F411 Generalized anxiety disorder: Secondary | ICD-10-CM | POA: Diagnosis not present

## 2017-02-01 MED FILL — ESCITALOPRAM 20 MG TABLET: 20 | 30 days supply | Qty: 30 | Fill #0

## 2017-02-04 MED FILL — METOPROLOL SUCC ER 25 MG TA: 25 | 30 days supply | Qty: 30 | Fill #2

## 2017-03-31 DIAGNOSIS — G4730 Sleep apnea, unspecified: Secondary | ICD-10-CM | POA: Diagnosis not present

## 2017-03-31 DIAGNOSIS — F329 Major depressive disorder, single episode, unspecified: Secondary | ICD-10-CM | POA: Diagnosis not present

## 2017-03-31 DIAGNOSIS — F411 Generalized anxiety disorder: Secondary | ICD-10-CM | POA: Diagnosis not present

## 2017-03-31 MED FILL — ESCITALOPRAM 20 MG TABLET: 20 | 30 days supply | Qty: 30 | Fill #1

## 2017-03-31 MED FILL — busPIRone HCL 10 MG TABS: 10 | 30 days supply | Qty: 60 | Fill #0

## 2017-03-31 MED FILL — METOPROLOL SUCC ER 25 MG TA: 25 | 30 days supply | Qty: 30 | Fill #3

## 2017-04-30 DIAGNOSIS — I10 Essential (primary) hypertension: Secondary | ICD-10-CM | POA: Diagnosis not present

## 2017-04-30 DIAGNOSIS — F411 Generalized anxiety disorder: Secondary | ICD-10-CM | POA: Diagnosis not present

## 2017-05-11 MED FILL — busPIRone HCL 10 MG TABS: 10 | 90 days supply | Qty: 180 | Fill #0

## 2017-05-11 MED FILL — ESCITALOPRAM 20 MG TABLET: 20 | 90 days supply | Qty: 90 | Fill #0

## 2017-05-11 MED FILL — METOPROLOL SUCCINATE ER 25: 25 | 90 days supply | Qty: 90 | Fill #0

## 2017-05-18 DIAGNOSIS — Z0000 Encounter for general adult medical examination without abnormal findings: Secondary | ICD-10-CM | POA: Diagnosis not present

## 2017-05-18 DIAGNOSIS — R5383 Other fatigue: Secondary | ICD-10-CM | POA: Diagnosis not present

## 2017-09-08 MED FILL — METOPROLOL SUCCINATE ER 25: 25 | 90 days supply | Qty: 90 | Fill #1

## 2017-09-08 MED FILL — ESCITALOPRAM 20 MG TABLET: 20 | 90 days supply | Qty: 90 | Fill #1

## 2017-09-25 ENCOUNTER — Encounter

## 2017-10-14 DIAGNOSIS — F411 Generalized anxiety disorder: Secondary | ICD-10-CM | POA: Diagnosis not present

## 2017-10-14 DIAGNOSIS — H04201 Unspecified epiphora, right lacrimal gland: Secondary | ICD-10-CM | POA: Diagnosis not present

## 2017-10-14 DIAGNOSIS — I10 Essential (primary) hypertension: Secondary | ICD-10-CM | POA: Diagnosis not present

## 2017-10-14 MED FILL — AZELASTINE HCL 0.05% DROPS: 0.05 | 60 days supply | Qty: 6 | Fill #0

## 2017-10-27 DIAGNOSIS — Z0000 Encounter for general adult medical examination without abnormal findings: Secondary | ICD-10-CM | POA: Diagnosis not present

## 2017-10-27 DIAGNOSIS — D1809 Hemangioma of other sites: Secondary | ICD-10-CM | POA: Diagnosis not present

## 2017-10-27 DIAGNOSIS — Z124 Encounter for screening for malignant neoplasm of cervix: Secondary | ICD-10-CM | POA: Diagnosis not present

## 2017-10-27 DIAGNOSIS — N898 Other specified noninflammatory disorders of vagina: Secondary | ICD-10-CM | POA: Diagnosis not present

## 2017-10-27 DIAGNOSIS — H5203 Hypermetropia, bilateral: Secondary | ICD-10-CM | POA: Diagnosis not present

## 2017-10-27 DIAGNOSIS — E559 Vitamin D deficiency, unspecified: Secondary | ICD-10-CM | POA: Diagnosis not present

## 2017-10-27 DIAGNOSIS — R82998 Other abnormal findings in urine: Secondary | ICD-10-CM | POA: Diagnosis not present

## 2017-10-27 DIAGNOSIS — R7301 Impaired fasting glucose: Secondary | ICD-10-CM | POA: Diagnosis not present

## 2017-10-27 MED FILL — VIT D2 1.25 MG (50,000 UNIT: 1.25 MG | 84 days supply | Qty: 12 | Fill #0

## 2017-10-28 LAB — HM PAP SMEAR: HM Pap smear: NORMAL

## 2017-10-28 LAB — RESULTS CONSOLE HPV: CHL HPV: NEGATIVE

## 2017-12-29 DIAGNOSIS — H9202 Otalgia, left ear: Secondary | ICD-10-CM | POA: Diagnosis not present

## 2017-12-29 MED FILL — CIPRODEX OTIC SUSPENSION: 0.3-0.1 | 18 days supply | Qty: 8 | Fill #0

## 2018-01-05 MED FILL — ESCITALOPRAM 20 MG TABLET: 20 | 90 days supply | Qty: 90 | Fill #0

## 2018-01-05 MED FILL — busPIRone HCL 10 MG TABS: 10 | 90 days supply | Qty: 90 | Fill #0

## 2018-05-18 MED FILL — ESCITALOPRAM 20 MG TABLET: 20 | 90 days supply | Qty: 90 | Fill #1

## 2018-05-18 MED FILL — busPIRone HCL 10 MG TABS: 10 | 90 days supply | Qty: 90 | Fill #1

## 2018-10-27 DIAGNOSIS — R7301 Impaired fasting glucose: Secondary | ICD-10-CM | POA: Diagnosis not present

## 2018-10-27 DIAGNOSIS — E559 Vitamin D deficiency, unspecified: Secondary | ICD-10-CM | POA: Diagnosis not present

## 2018-11-03 DIAGNOSIS — R7303 Prediabetes: Secondary | ICD-10-CM | POA: Diagnosis not present

## 2018-11-03 DIAGNOSIS — D229 Melanocytic nevi, unspecified: Secondary | ICD-10-CM | POA: Diagnosis not present

## 2018-11-03 DIAGNOSIS — F411 Generalized anxiety disorder: Secondary | ICD-10-CM | POA: Diagnosis not present

## 2018-11-03 MED FILL — busPIRone HCL 10 MG TABS: 10 | 90 days supply | Qty: 90 | Fill #0

## 2018-11-03 MED FILL — ESCITALOPRAM 20 MG TABLET: 20 | 90 days supply | Qty: 90 | Fill #0

## 2018-11-07 MED FILL — ALBUTEROL SULFATE HFA 108 (: 108 (90 BAS | 25 days supply | Qty: 9 | Fill #0

## 2018-11-18 MED FILL — ESCITALOPRAM 20 MG TABLET: 20 | 90 days supply | Qty: 90 | Fill #0

## 2018-11-18 MED FILL — ALBUTEROL SULFATE HFA 108 (: 108 (90 BAS | 25 days supply | Qty: 9 | Fill #0

## 2018-11-18 MED FILL — busPIRone HCL 10 MG TABS: 10 | 90 days supply | Qty: 90 | Fill #0

## 2018-11-30 MED FILL — ALBUTEROL SULFATE HFA 108 (: 108 (90 BAS | 25 days supply | Qty: 9 | Fill #0

## 2018-11-30 MED FILL — busPIRone HCL 10 MG TABS: 10 | 90 days supply | Qty: 90 | Fill #0

## 2018-11-30 MED FILL — ESCITALOPRAM 20 MG TABLET: 20 | 90 days supply | Qty: 90 | Fill #0

## 2019-03-13 DIAGNOSIS — D1801 Hemangioma of skin and subcutaneous tissue: Secondary | ICD-10-CM | POA: Diagnosis not present

## 2019-03-13 DIAGNOSIS — D224 Melanocytic nevi of scalp and neck: Secondary | ICD-10-CM | POA: Diagnosis not present

## 2019-03-13 DIAGNOSIS — D2272 Melanocytic nevi of left lower limb, including hip: Secondary | ICD-10-CM | POA: Diagnosis not present

## 2019-03-13 MED FILL — ESCITALOPRAM 20 MG TABLET: 20 | 90 days supply | Qty: 90 | Fill #1

## 2019-03-13 MED FILL — busPIRone HCL 10 MG TABS: 10 | 90 days supply | Qty: 90 | Fill #1

## 2019-06-14 MED FILL — busPIRone HCL 10 MG TABS: 10 | 30 days supply | Qty: 30 | Fill #0

## 2019-06-14 MED FILL — ESCITALOPRAM 20 MG TABLET: 20 | 30 days supply | Qty: 30 | Fill #0

## 2019-08-28 ENCOUNTER — Ambulatory Visit (INDEPENDENT_AMBULATORY_CARE_PROVIDER_SITE_OTHER): Payer: 59 | Admitting: Family Medicine

## 2019-08-28 ENCOUNTER — Other Ambulatory Visit: Payer: Self-pay

## 2019-08-28 ENCOUNTER — Encounter (INDEPENDENT_AMBULATORY_CARE_PROVIDER_SITE_OTHER): Payer: Self-pay | Admitting: Family Medicine

## 2019-08-28 VITALS — BP 134/82 | HR 79 | Temp 97.8°F | Ht 62.0 in | Wt 257.0 lb

## 2019-08-28 DIAGNOSIS — Z6841 Body Mass Index (BMI) 40.0 and over, adult: Secondary | ICD-10-CM | POA: Diagnosis not present

## 2019-08-28 DIAGNOSIS — R0602 Shortness of breath: Secondary | ICD-10-CM

## 2019-08-28 DIAGNOSIS — R5383 Other fatigue: Secondary | ICD-10-CM | POA: Diagnosis not present

## 2019-08-28 DIAGNOSIS — Z9189 Other specified personal risk factors, not elsewhere classified: Secondary | ICD-10-CM

## 2019-08-28 DIAGNOSIS — Z1331 Encounter for screening for depression: Secondary | ICD-10-CM | POA: Diagnosis not present

## 2019-08-28 DIAGNOSIS — R7303 Prediabetes: Secondary | ICD-10-CM

## 2019-08-28 DIAGNOSIS — E559 Vitamin D deficiency, unspecified: Secondary | ICD-10-CM

## 2019-08-28 DIAGNOSIS — Z0289 Encounter for other administrative examinations: Secondary | ICD-10-CM

## 2019-08-28 NOTE — Progress Notes (Signed)
Chief Complaint:   OBESITY Melissa Mason (MR# [id number]) is a 34 y.o. female who presents for evaluation and treatment of obesity and related comorbidities. Current BMI is Body mass index is 47.01 kg/m.Melissa Mason [name] has been struggling with her weight for many years and has been unsuccessful in either losing weight, maintaining weight loss, or reaching her healthy weight goal.  Melissa Mason is currently in the action stage of change and ready to dedicate time achieving and maintaining a healthier weight. [name] is interested in becoming our patient and working on intensive lifestyle modifications including (but not limited to) diet and exercise for weight loss.  Melissa Mason heard about our clinic from [facility]. She skips breakfast or will go through the drive thru and get 4 count chicken mini combo with hashbrown and Diet Coke (feels full); Lunch (hungry) goes to the cafeteria at the hospital and will get a buffalo chicken wrap, chocolate chip cookies, and a Diet Coke (feels full); Dinner is Multimedia programmer, fries, and sweet tea.  Melissa Mason's habits were reviewed today and are as follows: Her family eats meals together, she thinks her family will eat healthier with her, her desired weight loss is 87 lbs, she has been heavy most of her life, she started gaining weight after college, her heaviest weight ever was 262 pounds, she craves breads and pasta, she skips breakfast frequently, she frequently makes poor food choices, she has problems with excessive hunger, she frequently eats larger portions than normal and she struggles with emotional eating.  Depression Screen Melissa Mason's Food and Mood (modified PHQ-9) score was 16.  Depression screen PHQ 2/9 08/28/2019  Decreased Interest 1  Down, Depressed, Hopeless 2  PHQ - 2 Score 3  Altered sleeping 2  Tired, decreased energy 3  Change in appetite 2  Feeling bad or failure about yourself  3  Trouble concentrating 3  Moving slowly or fidgety/restless 0  Suicidal  thoughts 0  PHQ-9 Score 16  Difficult doing work/chores Not difficult at all   Subjective:   Other fatigue. Carrye denies daytime somnolence and admits to waking up still tired. Melissa Mason generally gets 5-6 hours of sleep per night, and states that she does not sleep well most nights. Snoring is present. Apneic episodes are not present. Epworth Sleepiness Score is 4.  SOB (shortness of breath) on exertion. [name] notes increasing shortness of breath with exercising and seems to be worsening over time with weight gain. She notes getting out of breath sooner with activity than she used to. This has gotten worse recently. [name] denies shortness of breath at rest or orthopnea.  Prediabetes. Melissa Mason has a diagnosis of prediabetes based on her elevated HgA1c and was informed this puts her at greater risk of developing diabetes. She continues to work on diet and exercise to decrease her risk of diabetes. She denies nausea or hypoglycemia. Melissa Mason was diagnosed in August 2020. She is on no medication.  No results found for: HGBA1C No results found for: INSULIN  Vitamin D deficiency. [name] was previously on Vitamin D supplementation.  Depression screening. [name] had a strongly positive depression screen with a PHQ-9 score of 16.  At risk for diabetes mellitus. Melissa Mason is at higher than average risk for developing diabetes due to her obesity.   Assessment/Plan:   Other fatigue. Melissa Mason does feel that her weight is causing her energy to be lower than it should be. Fatigue may be related to obesity, depression or many other causes. Labs will be ordered, and  in the meanwhile, [name] will focus on self care including making healthy food choices, increasing physical activity and focusing on stress reduction. EKG 12-Lead showed normal sinus rhythm at 91 BPM. Comprehensive metabolic panel, CBC with Differential/Platelet, Lipid Panel With LDL/HDL Ratio, Vitamin B12, Folate, T3, T4, TSH labs ordered today.  SOB (shortness of  breath) on exertion. [name] does feel that she gets out of breath more easily that she used to when she exercises. [name]'s shortness of breath appears to be obesity related and exercise induced. She has agreed to work on weight loss and gradually increase exercise to treat her exercise induced shortness of breath. Will continue to monitor closely.   Prediabetes. Melissa Mason will continue to work on weight loss, exercise, and decreasing simple carbohydrates to help decrease the risk of diabetes.  Comprehensive metabolic panel, Hemoglobin A1c, Insulin, random labs ordered today.  Vitamin D deficiency. Low Vitamin D level contributes to fatigue and are associated with obesity, breast, and colon cancer. VITAMIN D 25 Hydroxy (Vit-D Deficiency, Fractures) level ordered today.  Depression screening. Melissa Mason had a positive depression screening. Depression is commonly associated with obesity and often results in emotional eating behaviors. We will monitor this closely and work on CBT to help improve the non-hunger eating patterns. Referral to Psychology may be required if no improvement is seen as she continues in our clinic.  At risk for diabetes mellitus. Melissa Mason was given approximately 15 minutes of diabetes education and counseling today. We discussed intensive lifestyle modifications today with an emphasis on weight loss as well as increasing exercise and decreasing simple carbohydrates in her diet. We also reviewed medication options with an emphasis on risk versus benefit of those discussed.   Repetitive spaced learning was employed today to elicit superior memory formation and behavioral change.  Class 3 severe obesity with serious comorbidity and body mass index (BMI) of 45.0 to 49.9 in adult, unspecified obesity type ([place]).  Melissa Mason is currently in the action stage of change and her goal is to continue with weight loss efforts. I recommend Melissa Mason begin the structured treatment plan as follows:  She has agreed to  the Category 4 Plan.  Exercise goals: No exercise has been prescribed at this time.   Behavioral modification strategies: increasing lean protein intake, increasing vegetables, meal planning and cooking strategies and keeping healthy foods in the home.  She was informed of the importance of frequent follow-up visits to maximize her success with intensive lifestyle modifications for her multiple health conditions. She was informed we would discuss her lab results at her next visit unless there is a critical issue that needs to be addressed sooner. Amiylah agreed to keep her next visit at the agreed upon time to discuss these results.  Objective:   Blood pressure 134/82, pulse 79, temperature 97.8 F (36.6 C), temperature source Oral, height 5\' 2"  (1.575 m), weight 257 lb (116.6 kg), last menstrual period 08/25/2019, SpO2 97 %. Body mass index is 47.01 kg/m.  Indirect Calorimeter completed today shows a VO2 of 366 and a REE of 2545.  Her calculated basal metabolic rate is 9323 thus her basal metabolic rate is better than expected.  General: Cooperative, alert, well developed, in no acute distress. HEENT: Conjunctivae and lids unremarkable. Cardiovascular: Regular rhythm.  Lungs: Normal work of breathing. Neurologic: No focal deficits.   Lab Results  Component Value Date   CREATININE 0.80 05/26/2012   BUN 17 05/26/2012   NA 138 05/26/2012   K 3.3 (L) 05/26/2012   CL  100 05/26/2012   CO2 26 05/26/2012   No results found for: ALT, AST, GGT, ALKPHOS, BILITOT No results found for: HGBA1C No results found for: INSULIN No results found for: TSH No results found for: CHOL, HDL, LDLCALC, LDLDIRECT, TRIG, CHOLHDL No results found for: WBC, HGB, HCT, MCV, PLT No results found for: IRON, TIBC, FERRITIN  Attestation Statements:   This is the patient's first visit at [facility]. The patient's NEW PATIENT PACKET was reviewed at length. Included in the packet: current and past  health history, medications, allergies, ROS, gynecologic history (women only), surgical history, family history, social history, weight history, weight loss surgery history (for those that have had weight loss surgery), nutritional evaluation, mood and food questionnaire, PHQ9, Epworth questionnaire, sleep habits questionnaire, patient life and health improvement goals questionnaire. These will all be scanned into the patient's chart under media.   During the visit, I independently reviewed the patient's EKG, bioimpedance scale results, and indirect calorimeter results. I used this information to tailor a meal plan for the patient that will help her to lose weight and will improve her obesity-related conditions going forward. I performed a medically necessary appropriate examination and/or evaluation. I discussed the assessment and treatment plan with the patient. The patient was provided an opportunity to ask questions and all were answered. The patient agreed with the plan and demonstrated an understanding of the instructions. Labs were ordered at this visit and will be reviewed at the next visit unless more critical results need to be addressed immediately. Clinical information was updated and documented in the EMR.   Time spent on visit including pre-visit chart review and post-visit care was 45 minutes.   A separate 15 minutes was spent on risk counseling (see above).   I, Michaelene Song, am acting as transcriptionist for Coralie Common, MD   I have reviewed the above documentation for accuracy and completeness, and I agree with the above. - Jinny Blossom, MD

## 2019-08-29 LAB — CBC WITH DIFFERENTIAL/PLATELET
Basophils Absolute: 0.1 10*3/uL (ref 0.0–0.2)
Basos: 1 %
EOS (ABSOLUTE): 0.2 10*3/uL (ref 0.0–0.4)
Eos: 2 %
Hematocrit: 35.2 % (ref 34.0–46.6)
Hemoglobin: 11.2 g/dL (ref 11.1–15.9)
Immature Grans (Abs): 0 10*3/uL (ref 0.0–0.1)
Immature Granulocytes: 0 %
Lymphocytes Absolute: 1.7 10*3/uL (ref 0.7–3.1)
Lymphs: 19 %
MCH: 24.8 pg — ABNORMAL LOW (ref 26.6–33.0)
MCHC: 31.8 g/dL (ref 31.5–35.7)
MCV: 78 fL — ABNORMAL LOW (ref 79–97)
Monocytes Absolute: 0.7 10*3/uL (ref 0.1–0.9)
Monocytes: 7 %
Neutrophils Absolute: 6.6 10*3/uL (ref 1.4–7.0)
Neutrophils: 71 %
Platelets: 429 10*3/uL (ref 150–450)
RBC: 4.52 x10E6/uL (ref 3.77–5.28)
RDW: 14 % (ref 11.7–15.4)
WBC: 9.2 10*3/uL (ref 3.4–10.8)

## 2019-08-29 LAB — COMPREHENSIVE METABOLIC PANEL
ALT: 14 IU/L (ref 0–32)
AST: 13 IU/L (ref 0–40)
Albumin/Globulin Ratio: 1.3 (ref 1.2–2.2)
Albumin: 4.4 g/dL (ref 3.8–4.8)
Alkaline Phosphatase: 95 IU/L (ref 48–121)
BUN/Creatinine Ratio: 21 (ref 9–23)
BUN: 15 mg/dL (ref 6–20)
Bilirubin Total: 0.3 mg/dL (ref 0.0–1.2)
CO2: 22 mmol/L (ref 20–29)
Calcium: 9.4 mg/dL (ref 8.7–10.2)
Chloride: 101 mmol/L (ref 96–106)
Creatinine, Ser: 0.71 mg/dL (ref 0.57–1.00)
GFR calc Af Amer: 128 mL/min/{1.73_m2} (ref >59)
GFR calc non Af Amer: 111 mL/min/{1.73_m2} (ref >59)
Globulin, Total: 3.3 g/dL (ref 1.5–4.5)
Glucose: 99 mg/dL (ref 65–99)
Potassium: 4.7 mmol/L (ref 3.5–5.2)
Sodium: 138 mmol/L (ref 134–144)
Total Protein: 7.7 g/dL (ref 6.0–8.5)

## 2019-08-29 LAB — LIPID PANEL WITH LDL/HDL RATIO
Cholesterol, Total: 172 mg/dL (ref 100–199)
HDL: 51 mg/dL (ref >39)
LDL Chol Calc (NIH): 108 mg/dL — ABNORMAL HIGH (ref 0–99)
LDL/HDL Ratio: 2.1 ratio (ref 0.0–3.2)
Triglycerides: 69 mg/dL (ref 0–149)
VLDL Cholesterol Cal: 13 mg/dL (ref 5–40)

## 2019-08-29 LAB — HEMOGLOBIN A1C
Est. average glucose Bld gHb Est-mCnc: 120 mg/dL
Hgb A1c MFr Bld: 5.8 % — ABNORMAL HIGH (ref 4.8–5.6)

## 2019-08-29 LAB — VITAMIN D 25 HYDROXY (VIT D DEFICIENCY, FRACTURES): Vit D, 25-Hydroxy: 17.3 ng/mL — ABNORMAL LOW (ref 30.0–100.0)

## 2019-08-29 LAB — INSULIN, RANDOM: INSULIN: 23 u[IU]/mL (ref 2.6–24.9)

## 2019-08-29 LAB — VITAMIN B12: Vitamin B-12: 373 pg/mL (ref 232–1245)

## 2019-08-29 LAB — FOLATE: Folate: 13.2 ng/mL (ref >3.0)

## 2019-08-29 LAB — T3: T3, Total: 116 ng/dL (ref 71–180)

## 2019-08-29 LAB — TSH: TSH: 2.3 u[IU]/mL (ref 0.450–4.500)

## 2019-08-29 LAB — T4: T4, Total: 5.6 ug/dL (ref 4.5–12.0)

## 2019-08-30 ENCOUNTER — Encounter (INDEPENDENT_AMBULATORY_CARE_PROVIDER_SITE_OTHER): Payer: Self-pay | Admitting: Family Medicine

## 2019-08-31 NOTE — Telephone Encounter (Signed)
Please advise 

## 2019-09-07 ENCOUNTER — Encounter (INDEPENDENT_AMBULATORY_CARE_PROVIDER_SITE_OTHER): Payer: Self-pay

## 2019-09-12 ENCOUNTER — Ambulatory Visit (INDEPENDENT_AMBULATORY_CARE_PROVIDER_SITE_OTHER): Payer: 59 | Admitting: Family Medicine

## 2019-09-12 ENCOUNTER — Other Ambulatory Visit: Payer: Self-pay

## 2019-09-12 ENCOUNTER — Encounter (INDEPENDENT_AMBULATORY_CARE_PROVIDER_SITE_OTHER): Payer: Self-pay | Admitting: Family Medicine

## 2019-09-12 VITALS — BP 131/82 | HR 79 | Temp 98.3°F | Ht 62.0 in | Wt 246.0 lb

## 2019-09-12 DIAGNOSIS — Z9189 Other specified personal risk factors, not elsewhere classified: Secondary | ICD-10-CM | POA: Diagnosis not present

## 2019-09-12 DIAGNOSIS — Z6841 Body Mass Index (BMI) 40.0 and over, adult: Secondary | ICD-10-CM

## 2019-09-12 DIAGNOSIS — E559 Vitamin D deficiency, unspecified: Secondary | ICD-10-CM | POA: Diagnosis not present

## 2019-09-12 DIAGNOSIS — R7303 Prediabetes: Secondary | ICD-10-CM

## 2019-09-12 DIAGNOSIS — E7849 Other hyperlipidemia: Secondary | ICD-10-CM | POA: Diagnosis not present

## 2019-09-12 MED ORDER — VITAMIN D (ERGOCALCIFEROL) 1.25 MG (50000 UNIT) PO CAPS: 50000.0000 [IU] | ORAL_CAPSULE | ORAL | 0 refills | Status: DC

## 2019-09-12 MED FILL — VIT D2 1.25 MG (50,000 UNIT: 1.25 MG | 28 days supply | Qty: 4 | Fill #0

## 2019-09-14 NOTE — Progress Notes (Signed)
Chief Complaint:   OBESITY Melissa Mason is here to discuss her progress with her obesity treatment plan along with follow-up of her obesity related diagnoses. [name] is on the Category 4 Plan and states she is following her eating plan approximately 85% of the time. [name] states she is being more active.  Today's visit was #: 2 Starting weight: 257 lbs Starting date: 08/28/2019 Today's weight: 246 lbs Today's date: 09/12/2019 Total lbs lost to date: 11 Total lbs lost since last in-office visit: 11  Interim History: [name] doesn't feel she is eating all the food. She denies hunger between meals but some cravings for bread. She didn't use snack calories for indulgences instead chose more nutritious options. Dinner is difficult as she doesn't cook a lot.  Subjective:   1. Vitamin D deficiency Melissa Mason has a new diagnosis of Vit D deficiency. Last Vit D level was 17.3. She is not on Vit D supplementation, and she notes fatigue. I discussed labs with the patient today.  2. Pre-diabetes Melissa Mason's last A1c was 5.8 and insulin level 23.0. This is not a new diagnosis. I discussed labs with the patient today.  3. Other hyperlipidemia [name]'s last LDL was 108, HDL 51, and triglycerides 69. She is not on statin. I discussed labs with the patient today.  4. At risk for osteoporosis Melissa Mason is at higher risk of osteopenia and osteoporosis due to Vitamin D deficiency.   Assessment/Plan:   1. Vitamin D deficiency Low Vitamin D level contributes to fatigue and are associated with obesity, breast, and colon cancer. Hedi agreed to start prescription Vitamin D 50,000 IU every week with no refills. She will follow-up for routine testing of Vitamin D, at least 2-3 times per year to avoid over-replacement.  - Vitamin D, Ergocalciferol, (DRISDOL) 1.25 MG (50000 UNIT) CAPS capsule; Take 1 capsule (50,000 Units total) by mouth every 7 (seven) days.  Dispense: 4 capsule; Refill: 0  2. Pre-diabetes Melissa Mason will  continue to work on weight loss, exercise, and decreasing simple carbohydrates to help decrease the risk of diabetes. We will repeat labs in 3 months. No medications was prescribed at this time.  3. Other hyperlipidemia Cardiovascular risk and specific lipid/LDL goals reviewed. We discussed several lifestyle modifications today and Melissa Mason will continue to work on diet, exercise and weight loss efforts. We will repeat labs in 3 months. Orders and follow up as documented in patient record.   Counseling Intensive lifestyle modifications are the first line treatment for this issue. . Dietary changes: Increase soluble fiber. Decrease simple carbohydrates. . Exercise changes: Moderate to vigorous-intensity aerobic activity 150 minutes per week if tolerated. . Lipid-lowering medications: see documented in medical record.  4. At risk for osteoporosis Melissa Mason was given approximately 30 minutes of osteoporosis prevention counseling today. Melissa Mason is at risk for osteopenia and osteoporosis due to her Vitamin D deficiency. She was encouraged to take her Vitamin D and follow her higher calcium diet and increase strengthening exercise to help strengthen her bones and decrease her risk of osteopenia and osteoporosis.  Repetitive spaced learning was employed today to elicit superior memory formation and behavioral change.  5. Class 3 severe obesity with serious comorbidity and body mass index (BMI) of 45.0 to 49.9 in adult, unspecified obesity type ([place]) Melissa Mason is currently in the action stage of change. As such, her goal is to continue with weight loss efforts. She has agreed to the Category 4 Plan and keeping a food journal and adhering to recommended goals of  550-700 calories and 50+ grams of protein at supper daily.   Exercise goals: All adults should avoid inactivity. Some physical activity is better than none, and adults who participate in any amount of physical activity gain some health benefits.  Behavioral  modification strategies: increasing lean protein intake, increasing vegetables, meal planning and cooking strategies and keeping healthy foods in the home.  Melissa Mason has agreed to follow-up with our clinic in 2 weeks. She was informed of the importance of frequent follow-up visits to maximize her success with intensive lifestyle modifications for her multiple health conditions.   Objective:   Blood pressure 131/82, pulse 79, temperature 98.3 F (36.8 C), temperature source Oral, height 5\' 2"  (1.575 m), weight 246 lb (111.6 kg), last menstrual period 09/01/2019, SpO2 97 %. Body mass index is 44.99 kg/m.  General: Cooperative, alert, well developed, in no acute distress. HEENT: Conjunctivae and lids unremarkable. Cardiovascular: Regular rhythm.  Lungs: Normal work of breathing. Neurologic: No focal deficits.   Lab Results  Component Value Date   CREATININE 0.71 08/28/2019   BUN 15 08/28/2019   NA 138 08/28/2019   K 4.7 08/28/2019   CL 101 08/28/2019   CO2 22 08/28/2019   Lab Results  Component Value Date   ALT 14 08/28/2019   AST 13 08/28/2019   ALKPHOS 95 08/28/2019   BILITOT 0.3 08/28/2019   Lab Results  Component Value Date   HGBA1C 5.8 (H) 08/28/2019   Lab Results  Component Value Date   INSULIN 23.0 08/28/2019   Lab Results  Component Value Date   TSH 2.300 08/28/2019   Lab Results  Component Value Date   CHOL 172 08/28/2019   HDL 51 08/28/2019   LDLCALC 108 (H) 08/28/2019   TRIG 69 08/28/2019   Lab Results  Component Value Date   WBC 9.2 08/28/2019   HGB 11.2 08/28/2019   HCT 35.2 08/28/2019   MCV 78 (L) 08/28/2019   PLT 429 08/28/2019   No results found for: IRON, TIBC, FERRITIN  Attestation Statements:   Reviewed by clinician on day of visit: allergies, medications, problem list, medical history, surgical history, family history, social history, and previous encounter notes.   I, Trixie Dredge, am acting as transcriptionist for Coralie Common,  MD.  I have reviewed the above documentation for accuracy and completeness, and I agree with the above. - Jinny Blossom, MD

## 2019-09-22 MED FILL — VIT D2 1.25 MG (50,000 UNIT: 1.25 MG | 28 days supply | Qty: 4 | Fill #0

## 2019-09-25 ENCOUNTER — Encounter: Payer: Self-pay | Admitting: Medical

## 2019-09-25 ENCOUNTER — Other Ambulatory Visit: Payer: Self-pay

## 2019-09-25 ENCOUNTER — Ambulatory Visit: Payer: 59 | Admitting: Medical

## 2019-09-25 VITALS — BP 128/80 | HR 99 | Ht 62.0 in | Wt 248.4 lb

## 2019-09-25 DIAGNOSIS — E559 Vitamin D deficiency, unspecified: Secondary | ICD-10-CM | POA: Diagnosis not present

## 2019-09-25 DIAGNOSIS — F411 Generalized anxiety disorder: Secondary | ICD-10-CM

## 2019-09-25 DIAGNOSIS — R300 Dysuria: Secondary | ICD-10-CM | POA: Diagnosis not present

## 2019-09-25 LAB — POCT URINALYSIS DIP (PROADVANTAGE DEVICE)
Bilirubin, UA: NEGATIVE
Blood, UA: NEGATIVE
Glucose, UA: NEGATIVE mg/dL
Ketones, POC UA: NEGATIVE mg/dL
Leukocytes, UA: NEGATIVE
Nitrite, UA: NEGATIVE
Protein Ur, POC: NEGATIVE mg/dL
Specific Gravity, Urine: 1.025
Urobilinogen, Ur: NEGATIVE
pH, UA: 6 (ref 5.0–8.0)

## 2019-09-25 MED ORDER — ESCITALOPRAM OXALATE 20 MG PO TABS: 20.0000 mg | ORAL_TABLET | Freq: Every day | ORAL | 0 refills | Status: DC

## 2019-09-25 MED ORDER — BUSPIRONE HCL 10 MG PO TABS: 10.0000 mg | ORAL_TABLET | Freq: Every day | ORAL | 0 refills | Status: DC

## 2019-09-25 MED FILL — busPIRone HCL 10 MG TABS: 10 | 90 days supply | Qty: 90 | Fill #0

## 2019-09-25 MED FILL — ESCITALOPRAM 20 MG TABLET: 20 | 90 days supply | Qty: 90 | Fill #0

## 2019-09-25 NOTE — Progress Notes (Signed)
Subjective:  Melissa Mason is a 34 y.o. female who presents for Chief Complaint  Patient presents with  . New Patient (Initial Visit)    establsih care-need meds refilled   . Urinary Tract Infection    burning sensation, urgency      Here as new patient.  Referred by Herbie [place].  Was seeing [facility] prior  Concerns: Needs refills.    She notes no history of UTI, but has some symptoms of concern.  She notes some burning at end of urination.  Annoying sensation.  Having some urinary frequency.  No blood in urine.  No cloudy urine.  No odor in urine except yesterday.  No belly or back pain.   No nausea, no vomiting, fever.  No body aches or chills, no diarrhea.   No vaginal discharge.   Married, female partner.   No concern for STD.  Drinks a lot of diet coke.  Hates coffee.    She notes hx/o anxiety, panic attacks.  Has been on Lexapro in the past.  Gets stressed out about things.  Sometimes feels she can't get ahead of bills or other stressors.   Has concerns about her weight.    Buspirone was added 2 years, takes this mostly once per day.  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   No hx/o depression.  No SI/HI.  No other aggravating or relieving factors.    No other c/o.  The following portions of the patient's history were reviewed and updated as appropriate: allergies, current medications, past family history, past medical history, past social history, past surgical history and problem list.  ROS Otherwise as in subjective above  Objective: BP 128/80   Pulse 99   Ht 5\' 2"  (1.575 m)   Wt 248 lb 6.4 oz (112.7 kg)   LMP 09/01/2019   SpO2 96%   BMI 45.43 kg/m   General appearance: alert, no distress, well developed, well nourished Abdomen: +bs, soft, non tender, non distended, no masses, no hepatomegaly, no splenomegaly Back: no CVA tenderness Psych: pleasant, answers questions appropriately   Assessment: Encounter Diagnoses  Name Primary?  .  Dysuria Yes  . Generalized anxiety disorder   . Vitamin D deficiency      Plan: Dysuria - UA normal.  Will send for culture.  In the meantime good hydration, less diet coke and caffeine.  F/u pending culture.  Generalized anxiety - advised she f/u with counseling.  Consider financial counseling as well.  C/t current medication  Vit D deficiency - currently evaluated and managed by weight management clinic  Melissa Mason was seen today for new patient (initial visit) and urinary tract infection.  Diagnoses and all orders for this visit:  Dysuria -     POCT Urinalysis DIP (Proadvantage Device) -     Urine Culture  Generalized anxiety disorder  Vitamin D deficiency  Other orders -     escitalopram (LEXAPRO) 20 MG tablet; Take 1 tablet (20 mg total) by mouth daily. -     busPIRone (BUSPAR) 10 MG tablet; Take 1 tablet (10 mg total) by mouth daily.   Follow up: pending culture

## 2019-09-26 ENCOUNTER — Ambulatory Visit (INDEPENDENT_AMBULATORY_CARE_PROVIDER_SITE_OTHER): Payer: 59 | Admitting: Family Medicine

## 2019-09-26 ENCOUNTER — Other Ambulatory Visit: Payer: Self-pay

## 2019-09-26 ENCOUNTER — Encounter (INDEPENDENT_AMBULATORY_CARE_PROVIDER_SITE_OTHER): Payer: Self-pay | Admitting: Family Medicine

## 2019-09-26 VITALS — BP 128/83 | HR 94 | Temp 98.4°F | Ht 62.0 in | Wt 243.0 lb

## 2019-09-26 DIAGNOSIS — F329 Major depressive disorder, single episode, unspecified: Secondary | ICD-10-CM | POA: Diagnosis not present

## 2019-09-26 DIAGNOSIS — E559 Vitamin D deficiency, unspecified: Secondary | ICD-10-CM

## 2019-09-26 DIAGNOSIS — F419 Anxiety disorder, unspecified: Secondary | ICD-10-CM | POA: Diagnosis not present

## 2019-09-26 DIAGNOSIS — Z6841 Body Mass Index (BMI) 40.0 and over, adult: Secondary | ICD-10-CM

## 2019-09-27 ENCOUNTER — Other Ambulatory Visit: Payer: Self-pay | Admitting: Medical

## 2019-09-27 LAB — URINE CULTURE

## 2019-09-27 MED ORDER — SULFAMETHOXAZOLE-TRIMETHOPRIM 800-160 MG PO TABS: 1.0000 | ORAL_TABLET | Freq: Two times a day (BID) | ORAL | 0 refills | Status: DC

## 2019-09-27 MED FILL — SULFAMETHOXAZOLE-TMP DS TAB: 800-160 | 5 days supply | Qty: 10 | Fill #0

## 2019-09-28 NOTE — Progress Notes (Signed)
Chief Complaint:   OBESITY Melissa Mason is here to discuss her progress with her obesity treatment plan along with follow-up of her obesity related diagnoses. Melissa Mason is on the Category 4 Plan and keeping a food journal and adhering to recommended goals of 550-700 calories and 50+ grams of protein at supper daily and states she is following her eating plan approximately 85% of the time. Melissa Mason states she is doing 0 minutes 0 times per week.  Today's visit was #: 3 Starting weight: 257 lbs Starting date: 08/28/2019 Today's weight: 243 lbs Today's date: 09/26/2019 Total lbs lost to date: 14 Total lbs lost since last in-office visit: 3  Interim History: Melissa Mason had a good few weeks, and ate pizza 1 day because her child wanted pizza. She is eating peanut butter sandwich and yogurt for breakfast with cheese stick. Lunch is lean cuisine, yogurt, apple, and ground [place] with lettuce and salsa for dinner. She snacks calories were protein bar. Her son's birthday is Sunday (4 days from now).  Subjective:   1. Vitamin D deficiency Melissa Mason denies nausea, vomiting, or muscle weakness, but she notes fatigue. She is on prescription Vit D.  2. Anxiety and depression Melissa Mason denies suicidal ideas or homicidal ideas. She is on Lexapro and Buspar.  Assessment/Plan:   1. Vitamin D deficiency Low Vitamin D level contributes to fatigue and are associated with obesity, breast, and colon cancer. Kyndal agreed to continue taking prescription Vitamin D 50,000 IU every week, no refill needed. She will follow-up for routine testing of Vitamin D, at least 2-3 times per year to avoid over-replacement.  2. Anxiety and depression Behavior modification techniques were discussed today to help Melissa Mason deal with her anxiety. Melissa Mason will continue her current medications, no refill needed. Orders and follow up as documented in patient record.   3. Class 3 severe obesity with serious comorbidity and body mass index (BMI) of 40.0 to  44.9 in adult, unspecified obesity type (Melissa Mason) Melissa Mason is currently in the action stage of change. As such, her goal is to continue with weight loss efforts. She has agreed to the Category 4 Plan.   Exercise goals: No exercise has been prescribed at this time.  Behavioral modification strategies: increasing lean protein intake, meal planning and cooking strategies, keeping healthy foods in the home and planning for success.  Melissa Mason has agreed to follow-up with our clinic in 2 weeks. She was informed of the importance of frequent follow-up visits to maximize her success with intensive lifestyle modifications for her multiple health conditions.   Objective:   Blood pressure 128/83, pulse 94, temperature 98.4 F (36.9 C), temperature source Oral, height 5\' 2"  (1.575 m), weight 243 lb (110.2 kg), last menstrual period 09/01/2019, SpO2 100 %. Body mass index is 44.45 kg/m.  General: Cooperative, alert, well developed, in no acute distress. HEENT: Conjunctivae and lids unremarkable. Cardiovascular: Regular rhythm.  Lungs: Normal work of breathing. Neurologic: No focal deficits.   Lab Results  Component Value Date   CREATININE 0.71 08/28/2019   BUN 15 08/28/2019   NA 138 08/28/2019   K 4.7 08/28/2019   CL 101 08/28/2019   CO2 22 08/28/2019   Lab Results  Component Value Date   ALT 14 08/28/2019   AST 13 08/28/2019   ALKPHOS 95 08/28/2019   BILITOT 0.3 08/28/2019   Lab Results  Component Value Date   HGBA1C 5.8 (H) 08/28/2019   Lab Results  Component Value Date   INSULIN 23.0 08/28/2019  Lab Results  Component Value Date   TSH 2.300 08/28/2019   Lab Results  Component Value Date   CHOL 172 08/28/2019   HDL 51 08/28/2019   LDLCALC 108 (H) 08/28/2019   TRIG 69 08/28/2019   Lab Results  Component Value Date   WBC 9.2 08/28/2019   HGB 11.2 08/28/2019   HCT 35.2 08/28/2019   MCV 78 (L) 08/28/2019   PLT 429 08/28/2019   No results found for: IRON, TIBC,  FERRITIN  Attestation Statements:   Reviewed by clinician on day of visit: allergies, medications, problem list, medical history, surgical history, family history, social history, and previous encounter notes.  Time spent on visit including pre-visit chart review and post-visit care and charting was 15 minutes.    I, Trixie Dredge, am acting as transcriptionist for Coralie Common, MD.  I have reviewed the above documentation for accuracy and completeness, and I agree with the above. - Jinny Blossom, MD

## 2019-10-11 ENCOUNTER — Other Ambulatory Visit: Payer: Self-pay

## 2019-10-11 ENCOUNTER — Encounter (INDEPENDENT_AMBULATORY_CARE_PROVIDER_SITE_OTHER): Payer: Self-pay | Admitting: Family Medicine

## 2019-10-11 ENCOUNTER — Ambulatory Visit (INDEPENDENT_AMBULATORY_CARE_PROVIDER_SITE_OTHER): Payer: 59 | Admitting: Family Medicine

## 2019-10-11 VITALS — BP 123/83 | HR 93 | Temp 98.1°F | Ht 62.0 in | Wt 240.0 lb

## 2019-10-11 DIAGNOSIS — R7303 Prediabetes: Secondary | ICD-10-CM

## 2019-10-11 DIAGNOSIS — E7849 Other hyperlipidemia: Secondary | ICD-10-CM

## 2019-10-11 DIAGNOSIS — Z9189 Other specified personal risk factors, not elsewhere classified: Secondary | ICD-10-CM | POA: Diagnosis not present

## 2019-10-11 DIAGNOSIS — F3289 Other specified depressive episodes: Secondary | ICD-10-CM

## 2019-10-11 DIAGNOSIS — Z6841 Body Mass Index (BMI) 40.0 and over, adult: Secondary | ICD-10-CM | POA: Diagnosis not present

## 2019-10-11 DIAGNOSIS — E559 Vitamin D deficiency, unspecified: Secondary | ICD-10-CM | POA: Diagnosis not present

## 2019-10-12 MED ORDER — VITAMIN D (ERGOCALCIFEROL) 1.25 MG (50000 UNIT) PO CAPS: 50000.0000 [IU] | ORAL_CAPSULE | ORAL | 0 refills | Status: DC

## 2019-10-12 NOTE — Progress Notes (Signed)
Chief Complaint:   OBESITY Melissa Mason is here to discuss her progress with her obesity treatment plan along with follow-up of her obesity related diagnoses. Melissa Mason is on the Category 4 Plan and states she is following her eating plan approximately 70-80% of the time. Melissa Mason states she is exercising for 0 minutes 0 times per week.  Today's visit was #: 4 Starting weight: 257 lbs Starting date: 08/28/2019 Today's weight: 240 lbs Today's date: 10/11/2019 Total lbs lost to date: 17 lbs Total lbs lost since last in-office visit: 3 lbs  Interim History: Melissa Mason says she had some stressors over the last couple of weeks, but did not self-sabotage like usual.  She says that a colleague recently had brain surgery for a brain tumor.  She endorses emotional eating, but she is proud that it was just meals and she stopped eating off plan sooner than she would have in the past.  She is measuring protein amounts/foods.  She says her hunger is controlled.  Cravings are good when she eats on plan.   Subjective:   1. Vitamin D deficiency Melissa Mason's Vitamin D level was 17.3 on 08/28/2019. She is currently taking prescription vitamin D 50,000 IU each week. She denies nausea, vomiting or muscle weakness.  2. Other hyperlipidemia Melissa Mason has hyperlipidemia and has been trying to improve her cholesterol levels with intensive lifestyle modification including a low saturated fat diet, exercise and weight loss. She denies any chest pain, claudication or myalgias.  Lab Results  Component Value Date   ALT 14 08/28/2019   AST 13 08/28/2019   ALKPHOS 95 08/28/2019   BILITOT 0.3 08/28/2019   Lab Results  Component Value Date   CHOL 172 08/28/2019   HDL 51 08/28/2019   LDLCALC 108 (H) 08/28/2019   TRIG 69 08/28/2019   3. Prediabetes Melissa Mason has a diagnosis of prediabetes based on her elevated HgA1c and was informed this puts her at greater risk of developing diabetes. She continues to work on diet and exercise to decrease  her risk of diabetes. She denies nausea or hypoglycemia.  Lab Results  Component Value Date   HGBA1C 5.8 (H) 08/28/2019   Lab Results  Component Value Date   INSULIN 23.0 08/28/2019   4. Other depression with emotional eating Positive history of mood disorder, mixed anxiety with panic, and some depressed mood.  She has been on Lexapro and Buspar for years per PCP.  She says it is working well for her and declines the need for adjustment, but would like to see Melissa Mason.  5. At risk for dehydration Melissa Mason is at risk for dehydration due to inadequate water intake.  Assessment/Plan:   1. Vitamin D deficiency Low Vitamin D level contributes to fatigue and are associated with obesity, breast, and colon cancer. She agrees to continue to take prescription Vitamin D @50 ,000 IU every week and will follow-up for routine testing of Vitamin D, at least 2-3 times per year to avoid over-replacement.  Recheck level after 2-3 months of taking consistently. - Vitamin D, Ergocalciferol, (DRISDOL) 1.25 MG (50000 UNIT) CAPS capsule; Take 1 capsule (50,000 Units total) by mouth every 7 (seven) days.  Dispense: 4 capsule; Refill: 0  2. Other hyperlipidemia Cardiovascular risk and specific lipid/LDL goals reviewed.  We discussed several lifestyle modifications today and Melissa Mason will continue to work on diet, exercise and weight loss efforts. Orders and follow up as documented in patient record.   Counseling Intensive lifestyle modifications are the first line treatment for  this issue. . Dietary changes: Increase soluble fiber. Decrease simple carbohydrates. . Exercise changes: Moderate to vigorous-intensity aerobic activity 150 minutes per week if tolerated. . Lipid-lowering medications: see documented in medical record.  3. Prediabetes Melissa Mason will continue to work on weight loss, exercise, and decreasing simple carbohydrates to help decrease the risk of diabetes.   4. Other depression with emotional  eating Patient was referred to Melissa Mason, our Bariatric Psychologist, for evaluation due to her elevated PHQ-9 score and significant struggles with emotional eating.  Continue medications per PCP.  Stress management strategies were discussed with her.  5. At risk for dehydration Melissa Mason was given approximately 15 minutes dehydration prevention counseling today. Melissa Mason is at risk for dehydration due to weight loss and current medication(s). She was encouraged to hydrate and monitor fluid status to avoid dehydration as well as weight loss plateaus.   6. Class 3 severe obesity with serious comorbidity and body mass index (BMI) of 40.0 to 44.9 in adult, unspecified obesity type ([place]) Melissa Mason is currently in the action stage of change. As such, her goal is to continue with weight loss efforts. She has agreed to the Category 4 Plan.   Exercise goals: As is.  Behavioral modification strategies: increasing lean protein intake, increasing water intake, decreasing eating out, no skipping meals, better snacking choices, emotional eating strategies and celebration eating strategies (she has an upcoming bachelorette party and we discussed strategies for success).  Melissa Mason has agreed to follow-up with our clinic in 2 weeks. She was informed of the importance of frequent follow-up visits to maximize her success with intensive lifestyle modifications for her multiple health conditions.   Objective:   Blood pressure 123/83, pulse 93, temperature 98.1 F (36.7 C), height 5\' 2"  (1.575 m), weight 240 lb (108.9 kg), SpO2 98 %. Body mass index is 43.9 kg/m.  General: Cooperative, alert, well developed, in no acute distress. HEENT: Conjunctivae and lids unremarkable. Cardiovascular: Regular rhythm.  Lungs: Normal work of breathing. Neurologic: No focal deficits.   Lab Results  Component Value Date   CREATININE 0.71 08/28/2019   BUN 15 08/28/2019   NA 138 08/28/2019   K 4.7 08/28/2019   CL 101 08/28/2019   CO2  22 08/28/2019   Lab Results  Component Value Date   ALT 14 08/28/2019   AST 13 08/28/2019   ALKPHOS 95 08/28/2019   BILITOT 0.3 08/28/2019   Lab Results  Component Value Date   HGBA1C 5.8 (H) 08/28/2019   Lab Results  Component Value Date   INSULIN 23.0 08/28/2019   Lab Results  Component Value Date   TSH 2.300 08/28/2019   Lab Results  Component Value Date   CHOL 172 08/28/2019   HDL 51 08/28/2019   LDLCALC 108 (H) 08/28/2019   TRIG 69 08/28/2019   Lab Results  Component Value Date   WBC 9.2 08/28/2019   HGB 11.2 08/28/2019   HCT 35.2 08/28/2019   MCV 78 (L) 08/28/2019   PLT 429 08/28/2019   Attestation Statements:   Reviewed by clinician on day of visit: allergies, medications, problem list, medical history, surgical history, family history, social history, and previous encounter notes.  I, Water quality scientist, CMA, am acting as Location manager for [employer], DO.  I have reviewed the above documentation for accuracy and completeness, and I agree with the above. Mellody Dance, DO
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
## 2019-10-24 MED FILL — VIT D2 1.25 MG (50,000 UNIT: 1.25 MG | 28 days supply | Qty: 4 | Fill #0

## 2019-10-25 ENCOUNTER — Ambulatory Visit (INDEPENDENT_AMBULATORY_CARE_PROVIDER_SITE_OTHER): Payer: 59 | Admitting: Family Medicine

## 2019-10-25 ENCOUNTER — Encounter (INDEPENDENT_AMBULATORY_CARE_PROVIDER_SITE_OTHER): Payer: Self-pay | Admitting: Family Medicine

## 2019-10-25 ENCOUNTER — Other Ambulatory Visit: Payer: Self-pay

## 2019-10-25 VITALS — BP 126/77 | HR 98 | Temp 98.0°F | Ht 62.0 in | Wt 240.0 lb

## 2019-10-25 DIAGNOSIS — R7303 Prediabetes: Secondary | ICD-10-CM | POA: Diagnosis not present

## 2019-10-25 DIAGNOSIS — E559 Vitamin D deficiency, unspecified: Secondary | ICD-10-CM | POA: Diagnosis not present

## 2019-10-25 DIAGNOSIS — Z6841 Body Mass Index (BMI) 40.0 and over, adult: Secondary | ICD-10-CM | POA: Diagnosis not present

## 2019-10-25 NOTE — Progress Notes (Signed)
Chief Complaint:   OBESITY Melissa Mason is here to discuss her progress with her obesity treatment plan along with follow-up of her obesity related diagnoses. Melissa Mason is on the Category 4 Plan and states she is following her eating plan approximately 60% of the time. Melissa Mason states she is exercising for 0 minutes 0 times per week.  Today's visit was #: 5 Starting weight: 257 lbs Starting date: 08/28/2019 Today's weight: 240 lbs Today's date: 10/25/2019 Total lbs lost to date: 17 lbs Total lbs lost since last in-office visit: 0  Interim History: Melissa Mason had a bachelorette weekend and was off plan.  She says that when she got back into town, she did not plan or prep foods.  She was off plan and even ate fast foods/drive thru often, which was her old habit.  Subjective:   1. Prediabetes Melissa Mason has a diagnosis of prediabetes based on her elevated HgA1c and was informed this puts her at greater risk of developing diabetes. She continues to work on diet and exercise to decrease her risk of diabetes. She denies nausea or hypoglycemia.  Lab Results  Component Value Date   HGBA1C 5.8 (H) 08/28/2019   Lab Results  Component Value Date   INSULIN 23.0 08/28/2019   2. Vitamin D deficiency Melissa Mason's Vitamin D level was 17.3 on 08/28/2019. She is currently taking prescription vitamin D 50,000 IU each week. She denies nausea, vomiting or muscle weakness.  Assessment/Plan:   1. Prediabetes Melissa Mason will continue to work on weight loss, exercise, and decreasing simple carbohydrates to help decrease the risk of diabetes.   2. Vitamin D deficiency Low Vitamin D level contributes to fatigue and are associated with obesity, breast, and colon cancer. She agrees to continue to take prescription Vitamin D @50 ,000 IU every week and will follow-up for routine testing of Vitamin D, at least 2-3 times per year to avoid over-replacement.  3. Class 3 severe obesity with serious comorbidity and body mass index (BMI) of  40.0 to 44.9 in adult, unspecified obesity type (Melissa Mason) Melissa Mason is currently in the action stage of change. As such, her goal is to continue with weight loss efforts. She has agreed to the Category 4 Plan.   Exercise goals: As is.  Behavioral modification strategies: increasing lean protein intake, decreasing simple carbohydrates, meal planning and cooking strategies, keeping healthy foods in the home and planning for success.  Melissa Mason has agreed to follow-up with our clinic in 2 weeks. She was informed of the importance of frequent follow-up visits to maximize her success with intensive lifestyle modifications for her multiple health conditions.   Objective:   Blood pressure 126/77, pulse 98, temperature 98 F (36.7 C), height 5\' 2"  (1.575 m), weight 240 lb (108.9 kg), SpO2 96 %. Body mass index is 43.9 kg/m.  General: Cooperative, alert, well developed, in no acute distress. HEENT: Conjunctivae and lids unremarkable. Cardiovascular: Regular rhythm.  Lungs: Normal work of breathing. Neurologic: No focal deficits.   Lab Results  Component Value Date   CREATININE 0.71 08/28/2019   BUN 15 08/28/2019   NA 138 08/28/2019   K 4.7 08/28/2019   CL 101 08/28/2019   CO2 22 08/28/2019   Lab Results  Component Value Date   ALT 14 08/28/2019   AST 13 08/28/2019   ALKPHOS 95 08/28/2019   BILITOT 0.3 08/28/2019   Lab Results  Component Value Date   HGBA1C 5.8 (H) 08/28/2019   Lab Results  Component Value Date   INSULIN 23.0  08/28/2019   Lab Results  Component Value Date   TSH 2.300 08/28/2019   Lab Results  Component Value Date   CHOL 172 08/28/2019   HDL 51 08/28/2019   LDLCALC 108 (H) 08/28/2019   TRIG 69 08/28/2019   Lab Results  Component Value Date   WBC 9.2 08/28/2019   HGB 11.2 08/28/2019   HCT 35.2 08/28/2019   MCV 78 (L) 08/28/2019   PLT 429 08/28/2019   Attestation Statements:   Reviewed by clinician on day of visit: allergies, medications, problem list,  medical history, surgical history, family history, social history, and previous encounter notes.  Time spent on visit including pre-visit chart review and post-visit care and charting was 26 minutes.   I, Water quality scientist, CMA, am acting as Location manager for [employer], DO.  I have reviewed the above documentation for accuracy and completeness, and I agree with the above. Mellody Dance, DO

## 2019-10-30 ENCOUNTER — Encounter (INDEPENDENT_AMBULATORY_CARE_PROVIDER_SITE_OTHER): Payer: Self-pay

## 2019-11-01 ENCOUNTER — Encounter (INDEPENDENT_AMBULATORY_CARE_PROVIDER_SITE_OTHER): Payer: Self-pay

## 2019-11-01 ENCOUNTER — Telehealth (INDEPENDENT_AMBULATORY_CARE_PROVIDER_SITE_OTHER): Payer: 59 | Admitting: Psychology

## 2019-11-08 ENCOUNTER — Ambulatory Visit (INDEPENDENT_AMBULATORY_CARE_PROVIDER_SITE_OTHER): Payer: 59 | Admitting: Family Medicine

## 2019-11-08 ENCOUNTER — Other Ambulatory Visit: Payer: Self-pay

## 2019-11-08 ENCOUNTER — Encounter (INDEPENDENT_AMBULATORY_CARE_PROVIDER_SITE_OTHER): Payer: Self-pay | Admitting: Family Medicine

## 2019-11-08 VITALS — BP 117/86 | HR 93 | Temp 98.6°F | Ht 62.0 in | Wt 236.0 lb

## 2019-11-08 DIAGNOSIS — R7303 Prediabetes: Secondary | ICD-10-CM | POA: Diagnosis not present

## 2019-11-08 DIAGNOSIS — Z6841 Body Mass Index (BMI) 40.0 and over, adult: Secondary | ICD-10-CM | POA: Diagnosis not present

## 2019-11-08 DIAGNOSIS — Z9189 Other specified personal risk factors, not elsewhere classified: Secondary | ICD-10-CM

## 2019-11-08 DIAGNOSIS — E559 Vitamin D deficiency, unspecified: Secondary | ICD-10-CM

## 2019-11-08 MED ORDER — VITAMIN D (ERGOCALCIFEROL) 1.25 MG (50000 UNIT) PO CAPS: 50000.0000 [IU] | ORAL_CAPSULE | ORAL | 0 refills | Status: DC

## 2019-11-09 ENCOUNTER — Encounter: Payer: 59 | Admitting: Medical

## 2019-11-09 NOTE — Progress Notes (Signed)
Chief Complaint:   OBESITY Melissa Mason is here to discuss her progress with her obesity treatment plan along with follow-up of her obesity related diagnoses. Melissa Mason is on the Category 4 Plan and states she is following her eating plan approximately 60% of the time. Melissa Mason states she is exercising for 0 minutes 0 times per week.  Today's visit was #: 6 Starting weight: 257 lbs Starting date: 08/28/2019 Today's weight: 236 lbs Today's date: 11/08/2019 Total lbs lost to date: 21 lbs Total lbs lost since last in-office visit: 4 lbs  Interim History: Melissa Mason says she had a GI stomach virus after her last appointment.  It lasted 4-5 days.  After that, she ate a BRAT diet for a couple days, thus she was "off plan" for a week.  She is very surprised she lost weight.  Hunger has been increased, but she started her period and is having cravings due to her menstrual cycle.  Subjective:   1. Vitamin D deficiency Melissa Mason's Vitamin D level was 17.3 on 08/28/2019. She is currently taking prescription vitamin D 50,000 IU each week. She denies nausea, vomiting or muscle weakness.  2. Prediabetes Melissa Mason has a diagnosis of prediabetes based on her elevated HgA1c and was informed this puts her at greater risk of developing diabetes. She continues to work on diet and exercise to decrease her risk of diabetes. She denies nausea or hypoglycemia.  Lab Results  Component Value Date   HGBA1C 5.8 (H) 08/28/2019   Lab Results  Component Value Date   INSULIN 23.0 08/28/2019   3. At risk for diabetes mellitus Melissa Mason is at higher than average risk for developing diabetes due to her obesity.   Assessment/Plan:   1. Vitamin D deficiency Low Vitamin D level contributes to fatigue and are associated with obesity, breast, and colon cancer. She agrees to continue to take prescription Vitamin D @50 ,000 IU every week and will follow-up for routine testing of Vitamin D, at least 2-3 times per year to avoid over-replacement.   Check labs at the end of September or early October.  -Refill Vitamin D, Ergocalciferol, (DRISDOL) 1.25 MG (50000 UNIT) CAPS capsule; Take 1 capsule (50,000 Units total) by mouth every 7 (seven) days.  Dispense: 4 capsule; Refill: 0  2. Prediabetes Melissa Mason will continue to work on weight loss, exercise, and decreasing simple carbohydrates to help decrease the risk of diabetes.  Recheck labs at the end of September/early October.  Continue prudent nutritional plan, weight loss.  Need to decrease simple carbs and eat proteins, etc., on meal plan.  3. At risk for diabetes mellitus  - Melissa Mason was given extensive diabetes prevention education and counseling today of more than 10+ minutes.  - Counseled patient on pathophysiology of disease and discussed various treatment options which always includes dietary and lifestyle modification as first line.   - Importance of healthy diet with very limited amounts of simple carbohydrates discussed with patient in addition to regular aerobic exercise of 33min 5d/week or more.  - Handouts provided at patient's desire and or told to go online at the American Diabetes Association website for further information   4. Class 3 severe obesity with serious comorbidity and body mass index (BMI) of 40.0 to 44.9 in adult, unspecified obesity type ([place]) Melissa Mason is currently in the action stage of change. As such, her goal is to continue with weight loss efforts. She has agreed to the Category 4 Plan.   Exercise goals: As is.  Behavioral modification strategies:  increasing lean protein intake, decreasing simple carbohydrates, increasing water intake, decreasing eating out, no skipping meals and planning for success.  Melissa Mason has agreed to follow-up with our clinic in 2 weeks. She was informed of the importance of frequent follow-up visits to maximize her success with intensive lifestyle modifications for her multiple health conditions.   Objective:   Blood pressure 117/86,  pulse 93, temperature 98.6 F (37 C), height 5\' 2"  (1.575 m), weight 236 lb (107 kg), SpO2 97 %. Body mass index is 43.16 kg/m.  General: Cooperative, alert, well developed, in no acute distress. HEENT: Conjunctivae and lids unremarkable. Cardiovascular: Regular rhythm.  Lungs: Normal work of breathing. Neurologic: No focal deficits.   Lab Results  Component Value Date   CREATININE 0.71 08/28/2019   BUN 15 08/28/2019   NA 138 08/28/2019   K 4.7 08/28/2019   CL 101 08/28/2019   CO2 22 08/28/2019   Lab Results  Component Value Date   ALT 14 08/28/2019   AST 13 08/28/2019   ALKPHOS 95 08/28/2019   BILITOT 0.3 08/28/2019   Lab Results  Component Value Date   HGBA1C 5.8 (H) 08/28/2019   Lab Results  Component Value Date   INSULIN 23.0 08/28/2019   Lab Results  Component Value Date   TSH 2.300 08/28/2019   Lab Results  Component Value Date   CHOL 172 08/28/2019   HDL 51 08/28/2019   LDLCALC 108 (H) 08/28/2019   TRIG 69 08/28/2019   Lab Results  Component Value Date   WBC 9.2 08/28/2019   HGB 11.2 08/28/2019   HCT 35.2 08/28/2019   MCV 78 (L) 08/28/2019   PLT 429 08/28/2019   Attestation Statements:   Reviewed by clinician on day of visit: allergies, medications, problem list, medical history, surgical history, family history, social history, and previous encounter notes.  I, Water quality scientist, CMA, am acting as Location manager for [employer], DO.  I have reviewed the above documentation for accuracy and completeness, and I agree with the above. Mellody Dance, DO

## 2019-11-22 ENCOUNTER — Ambulatory Visit (INDEPENDENT_AMBULATORY_CARE_PROVIDER_SITE_OTHER): Payer: 59 | Admitting: Family Medicine

## 2019-11-27 ENCOUNTER — Ambulatory Visit (INDEPENDENT_AMBULATORY_CARE_PROVIDER_SITE_OTHER): Payer: 59 | Admitting: Family Medicine

## 2019-11-27 ENCOUNTER — Other Ambulatory Visit: Payer: Self-pay

## 2019-11-27 ENCOUNTER — Encounter (INDEPENDENT_AMBULATORY_CARE_PROVIDER_SITE_OTHER): Payer: Self-pay | Admitting: Family Medicine

## 2019-11-27 VITALS — BP 133/86 | HR 87 | Temp 98.4°F | Ht 62.0 in | Wt 241.0 lb

## 2019-11-27 DIAGNOSIS — E559 Vitamin D deficiency, unspecified: Secondary | ICD-10-CM | POA: Diagnosis not present

## 2019-11-27 DIAGNOSIS — Z9189 Other specified personal risk factors, not elsewhere classified: Secondary | ICD-10-CM | POA: Diagnosis not present

## 2019-11-27 DIAGNOSIS — F3289 Other specified depressive episodes: Secondary | ICD-10-CM

## 2019-11-27 DIAGNOSIS — Z6841 Body Mass Index (BMI) 40.0 and over, adult: Secondary | ICD-10-CM

## 2019-11-27 MED ORDER — VITAMIN D (ERGOCALCIFEROL) 1.25 MG (50000 UNIT) PO CAPS: 50000.0000 [IU] | ORAL_CAPSULE | ORAL | 0 refills | Status: DC

## 2019-11-27 MED FILL — VIT D2 1.25 MG (50,000 UNIT: 1.25 MG | 28 days supply | Qty: 4 | Fill #0

## 2019-11-29 NOTE — Progress Notes (Signed)
Chief Complaint:   OBESITY Melissa Mason is here to discuss her progress with her obesity treatment plan along with follow-up of her obesity related diagnoses. Melissa Mason is on the Category 4 Plan and states she is following her eating plan approximately 10-15% of the time. Melissa Mason states she is exercising for 0 minutes 0 times per week.  Today's visit was #: 7 Starting weight: 257 lbs Starting date: 08/28/2019 Today's weight: 241 lbs Today's date: 11/27/2019 Total lbs lost to date: 16 lbs Total lbs lost since last in-office visit: 0  Interim History: Melissa Mason says that over the last 2 weeks, she went to the beach for the first week and then never got back on plan for the second week.  She feels like she failed herself and is feeling poorly about herself.  She says she has increased stress in her life now as well.  She says her spouse has a lot of increased stress as well.  Subjective:   1. Vitamin D deficiency Melissa Mason's Vitamin D level was 17.3 on 08/28/2019. She is currently taking prescription vitamin D 50,000 IU each week. She denies nausea, vomiting or muscle weakness.  2. Other depression, with emotional eating With anxiety and history of panic attacks.  She has been on Buspar and Lexapro for many, many months now.  She is tearful in the office today.  Denies SI or "dark thoughts".  She has 2 children at home.  3. At risk for osteoporosis Melissa Mason was given approximately 16 minutes of osteoporosis prevention counseling today.   Melissa Mason is at risk for osteopenia and osteoporosis due to Vitamin D deficiency, as well as other risk factors.  We discussed the importance of prudent screenings through her PCP's office for prevention.     Melissa Mason was encouraged to take her Vitamin D and follow her calcium rich diet.  We will continue to monitor vitamin D levels to ensure treatment is appropriate.   It is recommended that she eventually engage in weight bearing exercises and muscle strengthening exercises to help  improve bone density and decrease her risk of osteopenia and osteoporosis.  Assessment/Plan:   1. Vitamin D deficiency Low Vitamin D level contributes to fatigue and are associated with obesity, breast, and colon cancer. She agrees to continue to take prescription Vitamin D @50 ,000 IU every week and will follow-up for routine testing of Vitamin D, at least 2-3 times per year to avoid over-replacement.  -Refill Vitamin D, Ergocalciferol, (DRISDOL) 1.25 MG (50000 UNIT) CAPS capsule; Take 1 capsule (50,000 Units total) by mouth every 7 (seven) days.  Dispense: 4 capsule; Refill: 0  2. Other depression, with emotional eating She declines CBT referral.  She will work on diet, sleep regimen, exercise, meditation to improve mood.  Recheck in 2 weeks and we will monitor closely.  3. At risk for osteoporosis Melissa Mason was given approximately 16 minutes of osteoporosis prevention counseling today.   Melissa Mason is at risk for osteopenia and osteoporosis due to Vitamin D deficiency, as well as other risk factors.  We discussed the importance of prudent screenings through her PCP's office for prevention.     Melissa Mason was encouraged to take her Vitamin D and follow her calcium rich diet.  We will continue to monitor vitamin D levels to ensure treatment is appropriate.   It is recommended that she eventually engage in weight bearing exercises and muscle strengthening exercises to help improve bone density and decrease her risk of osteopenia and osteoporosis.  4. Class 3 severe  obesity with serious comorbidity and body mass index (BMI) of 40.0 to 44.9 in adult, unspecified obesity type (Melissa Mason) Melissa Mason is currently in the action stage of change. As such, her goal is to continue with weight loss efforts. She has agreed to the Category 4 Plan.   Exercise goals: All adults should avoid inactivity. Some physical activity is better than none, and adults who participate in any amount of physical activity gain some health benefits. Start  with walking for 15 minutes 3 days per week.  Behavioral modification strategies: meal planning and cooking strategies, keeping healthy foods in the home, better snacking choices, emotional eating strategies and planning for success.  Melissa Mason has agreed to follow-up with our clinic in 2 weeks. She was informed of the importance of frequent follow-up visits to maximize her success with intensive lifestyle modifications for her multiple health conditions.   Objective:   Blood pressure 133/86, pulse 87, temperature 98.4 F (36.9 C), height 5\' 2"  (1.575 m), weight 241 lb (109.3 kg), SpO2 99 %. Body mass index is 44.08 kg/m.  General: Cooperative, alert, well developed, in no acute distress. HEENT: Conjunctivae and lids unremarkable. Cardiovascular: Regular rhythm.  Lungs: Normal work of breathing. Neurologic: No focal deficits.   Lab Results  Component Value Date   CREATININE 0.71 08/28/2019   BUN 15 08/28/2019   NA 138 08/28/2019   K 4.7 08/28/2019   CL 101 08/28/2019   CO2 22 08/28/2019   Lab Results  Component Value Date   ALT 14 08/28/2019   AST 13 08/28/2019   ALKPHOS 95 08/28/2019   BILITOT 0.3 08/28/2019   Lab Results  Component Value Date   HGBA1C 5.8 (H) 08/28/2019   Lab Results  Component Value Date   INSULIN 23.0 08/28/2019   Lab Results  Component Value Date   TSH 2.300 08/28/2019   Lab Results  Component Value Date   CHOL 172 08/28/2019   HDL 51 08/28/2019   LDLCALC 108 (H) 08/28/2019   TRIG 69 08/28/2019   Lab Results  Component Value Date   WBC 9.2 08/28/2019   HGB 11.2 08/28/2019   HCT 35.2 08/28/2019   MCV 78 (L) 08/28/2019   PLT 429 08/28/2019   Attestation Statements:   Reviewed by clinician on day of visit: allergies, medications, problem list, medical history, surgical history, family history, social history, and previous encounter notes.  I, Water quality scientist, CMA, am acting as Location manager for [employer], DO.  I have reviewed the  above documentation for accuracy and completeness, and I agree with the above. -  Mellody Dance, DO

## 2019-12-11 ENCOUNTER — Ambulatory Visit (INDEPENDENT_AMBULATORY_CARE_PROVIDER_SITE_OTHER): Payer: 59 | Admitting: Family Medicine

## 2020-02-19 ENCOUNTER — Other Ambulatory Visit: Payer: Self-pay

## 2020-02-21 ENCOUNTER — Other Ambulatory Visit (HOSPITAL_COMMUNITY): Payer: Self-pay | Admitting: Medical

## 2020-02-21 MED ORDER — BUSPIRONE HCL 10 MG PO TABS: 10.0000 mg | ORAL_TABLET | Freq: Every day | ORAL | 0 refills | Status: DC

## 2020-02-21 MED ORDER — ESCITALOPRAM OXALATE 20 MG PO TABS: 20.0000 mg | ORAL_TABLET | Freq: Every day | ORAL | 0 refills | Status: DC

## 2020-02-21 MED FILL — busPIRone HCL 10 MG TABS: 10 | 90 days supply | Qty: 90 | Fill #0

## 2020-02-21 MED FILL — ESCITALOPRAM 20 MG TABLET: 20 | 90 days supply | Qty: 90 | Fill #0

## 2020-02-21 NOTE — Telephone Encounter (Signed)
Refill but schedule for a physical fasting

## 2020-03-26 MED FILL — busPIRone HCL 10 MG TABS: 10 | 90 days supply | Qty: 90 | Fill #0

## 2020-03-26 MED FILL — ESCITALOPRAM 20 MG TABLET: 20 | 90 days supply | Qty: 90 | Fill #0

## 2020-04-02 ENCOUNTER — Encounter: Payer: 59 | Admitting: Medical

## 2020-04-03 ENCOUNTER — Other Ambulatory Visit: Payer: Self-pay

## 2020-04-03 ENCOUNTER — Ambulatory Visit: Payer: 59 | Admitting: Medical

## 2020-04-03 ENCOUNTER — Encounter: Payer: Self-pay | Admitting: Medical

## 2020-04-03 ENCOUNTER — Other Ambulatory Visit (HOSPITAL_COMMUNITY): Payer: Self-pay | Admitting: Medical

## 2020-04-03 VITALS — BP 124/76 | HR 93 | Ht 62.0 in | Wt 260.4 lb

## 2020-04-03 DIAGNOSIS — Z6841 Body Mass Index (BMI) 40.0 and over, adult: Secondary | ICD-10-CM

## 2020-04-03 DIAGNOSIS — Z0000 Encounter for general adult medical examination without abnormal findings: Secondary | ICD-10-CM

## 2020-04-03 DIAGNOSIS — Z803 Family history of malignant neoplasm of breast: Secondary | ICD-10-CM | POA: Diagnosis not present

## 2020-04-03 DIAGNOSIS — R7301 Impaired fasting glucose: Secondary | ICD-10-CM

## 2020-04-03 DIAGNOSIS — J4520 Mild intermittent asthma, uncomplicated: Secondary | ICD-10-CM | POA: Diagnosis not present

## 2020-04-03 DIAGNOSIS — Z7185 Encounter for immunization safety counseling: Secondary | ICD-10-CM | POA: Insufficient documentation

## 2020-04-03 DIAGNOSIS — E559 Vitamin D deficiency, unspecified: Secondary | ICD-10-CM | POA: Diagnosis not present

## 2020-04-03 MED ORDER — ALBUTEROL SULFATE HFA 108 (90 BASE) MCG/ACT IN AERS: 2.0000 | INHALATION_SPRAY | Freq: Four times a day (QID) | RESPIRATORY_TRACT | 0 refills | Status: DC | PRN

## 2020-04-03 NOTE — Progress Notes (Signed)
Subjective:   HPI  Melissa Mason is a 35 y.o. female who presents for Chief Complaint  Patient presents with  . Annual Exam    Physical with fasting labs with no pap     Patient Care Team: Delan Ksiazek, Camelia Eng, PA-C as PCP - General (Family Medicine) Sees dentist Sees eye doctor Was seeing [facility] prior  Concerns: None  Married, female partner.  Reviewed their medical, surgical, family, social, medication, and allergy history and updated chart as appropriate.  Past Medical History:  Diagnosis Date  . Anxiety   . Asthma   . Depression   . Heartburn   . Pre-diabetes   . Reactive airway disease   . Seasonal allergies   . Vitamin D deficiency   . White coat syndrome without diagnosis of hypertension    prior to 2022    Family History  Problem Relation Age of Onset  . Depression Mother   . Obesity Mother   . Hypertension Father   . Sleep apnea Father   . Obesity Father   . Cancer Father        skin, basal  . Cancer Maternal Grandmother        breast  . Diabetes Maternal Grandmother   . Cancer Maternal Grandfather        prostate  . Cancer Paternal Grandmother        breast, lymphoma  . Alzheimer's disease Paternal Grandfather   . Diabetes Paternal Grandfather   . Heart disease Neg Hx   . Stroke Neg Hx      Current Outpatient Medications:  .  busPIRone (BUSPAR) 10 MG tablet, Take 1 tablet (10 mg total) by mouth daily., Disp: 90 tablet, Rfl: 0 .  escitalopram (LEXAPRO) 20 MG tablet, Take 1 tablet (20 mg total) by mouth daily., Disp: 90 tablet, Rfl: 0 .  albuterol (VENTOLIN HFA) 108 (90 Base) MCG/ACT inhaler, Inhale 2 puffs into the lungs every 6 (six) hours as needed for wheezing or shortness of breath., Disp: 18 g, Rfl: 0 .  Vitamin D, Ergocalciferol, (DRISDOL) 1.25 MG (50000 UNIT) CAPS capsule, Take 1 capsule (50,000 Units total) by mouth every 7 (seven) days. (Patient not taking: Reported on 04/03/2020), Disp: 4 capsule, Rfl:  0  Allergies  Allergen Reactions  . Pollen Extract Other (See Comments)    Sneezing sneezing      Review of Systems Constitutional: -fever, -chills, -sweats, -unexpected weight change, -decreased appetite, -fatigue Allergy: -sneezing, -itching, -congestion Dermatology: -changing moles, --rash, -lumps ENT: -runny nose, -ear pain, -sore throat, -hoarseness, -sinus pain, -teeth pain, - ringing in ears, -hearing loss, -nosebleeds Cardiology: -chest pain, -palpitations, -swelling, -difficulty breathing when lying flat, -waking up short of breath Respiratory: -cough, -shortness of breath, -difficulty breathing with exercise or exertion, -wheezing, -coughing up blood Gastroenterology: -abdominal pain, -nausea, -vomiting, -diarrhea, -constipation, -blood in stool, -changes in bowel movement, -difficulty swallowing or eating Hematology: -bleeding, -bruising  Musculoskeletal: -joint aches, -muscle aches, -joint swelling, -back pain, -neck pain, -cramping, -changes in gait Ophthalmology: denies vision changes, eye redness, itching, discharge Urology: -burning with urination, -difficulty urinating, -blood in urine, -urinary frequency, -urgency, -incontinence Neurology: -headache, -weakness, -tingling, -numbness, -memory loss, -falls, -dizziness Psychology: -depressed mood, -agitation, -sleep problems Breast/gyn: -breast tendnerss, -discharge, -lumps, -vaginal discharge,- irregular periods, -heavy periods     Objective:  BP 124/76   Pulse 93   Ht 5\' 2"  (1.575 m)   Wt 260 lb 6.4 oz (118.1 kg)   LMP 03/31/2020 (Exact Date)  SpO2 98%   BMI 47.63 kg/m   General appearance: alert, no distress, WD/WN, Caucasian female Skin: unremarkable, benign brown round 21mm raised lesion on right forehead and red/brown 83mm diameter papular lesion right neck benign appearing , unchanged for years per patient HEENT: normocephalic, conjunctiva/corneas normal, sclerae anicteric, PERRLA, EOMi, nares patent, no  discharge or erythema, pharynx normal Oral cavity: MMM, tongue normal, teeth normal Neck: supple, no lymphadenopathy, no thyromegaly, no masses, normal ROM, no bruits Chest: non tender, normal shape and expansion Heart: RRR, normal S1, S2, no murmurs Lungs: CTA bilaterally, no wheezes, rhonchi, or rales Abdomen: +bs, soft, non tender, non distended, no masses, no hepatomegaly, no splenomegaly, no bruits Back: non tender, normal ROM, no scoliosis Musculoskeletal: upper extremities non tender, no obvious deformity, normal ROM throughout, lower extremities non tender, no obvious deformity, normal ROM throughout Extremities: no edema, no cyanosis, no clubbing Pulses: 2+ symmetric, upper and lower extremities, normal cap refill Neurological: alert, oriented x 3, CN2-12 intact, strength normal upper extremities and lower extremities, sensation normal throughout, DTRs 2+ throughout, no cerebellar signs, gait normal Psychiatric: normal affect, behavior normal, pleasant  Breast/gyn/rectal - deferred to gynecology     Assessment and Plan :   Encounter Diagnoses  Name Primary?  . Encounter for health maintenance examination in adult Yes  . Vitamin D deficiency   . Impaired fasting blood sugar   . BMI 40.0-44.9, adult ([place])   . Family history of breast cancer   . Vaccine counseling   . Mild intermittent asthma, unspecified whether complicated     Today you had a preventative care visit or wellness visit.    Topics today may have included healthy lifestyle, diet, exercise, preventative care, vaccinations, sick and well care, proper use of emergency dept and after hours care, as well as other concerns.     Recommendations: Continue to return yearly for your annual wellness and preventative care visits.  This gives [place] a chance to discuss healthy lifestyle, exercise, vaccinations, review your chart record, and perform screenings where appropriate.  I recommend you see your eye doctor yearly for  routine vision care.  I recommend you see your dentist yearly for routine dental care including hygiene visits twice yearly.   Vaccination recommendations were reviewed  We will get copy of vaccines from employee health.  Likely UTD  Screening for cancer: Breast cancer screening: You should perform a self breast exam monthly.   We reviewed recommendations for regular mammograms and breast cancer screening.  Colon cancer screening:  Age 61  Cervical cancer screening: We reviewed recommendations for pap smear screening.  Reviewed 10/2017 pap from care everywhere records  Skin cancer screening: Check your skin regularly for new changes, growing lesions, or other lesions of concern Come in for evaluation if you have skin lesions of concern.  Lung cancer screening: If you have a greater than 30 pack year history of tobacco use, then you qualify for lung cancer screening with a chest CT scan  We currently don't have screenings for other cancers besides breast, cervical, colon, and lung cancers.  If you have a strong family history of cancer or have other cancer screening concerns, please let me know.    Bone health: Get at least 150 minutes of aerobic exercise weekly Get weight bearing exercise at least once weekly   Heart health: Get at least 150 minutes of aerobic exercise weekly Limit alcohol It is important to maintain a healthy blood pressure and healthy cholesterol numbers   Separate  significant issues discussed: BMI >40 - counseled on need to lose weight.  Consider therapies/referral upon lab review  impaired glucose - labs today  Vit D deficiency - she had completed a few months of therapy, not currently on therapy  Asthma -mild intermittent, refilled albuterol for prn use   Melissa Mason was seen today for annual exam.  Diagnoses and all orders for this visit:  Encounter for health maintenance examination in adult -     Lipid panel -     VITAMIN D 25 Hydroxy (Vit-D  Deficiency, Fractures) -     Comprehensive metabolic panel -     Hemoglobin A1c  Vitamin D deficiency -     VITAMIN D 25 Hydroxy (Vit-D Deficiency, Fractures)  Impaired fasting blood sugar -     Hemoglobin A1c  BMI 40.0-44.9, adult ([facility])  Family history of breast cancer  Vaccine counseling  Mild intermittent asthma, unspecified whether complicated  Other orders -     albuterol (VENTOLIN HFA) 108 (90 Base) MCG/ACT inhaler; Inhale 2 puffs into the lungs every 6 (six) hours as needed for wheezing or shortness of breath.     Follow-up pending labs, yearly for physical

## 2020-04-04 ENCOUNTER — Other Ambulatory Visit (HOSPITAL_COMMUNITY): Payer: Self-pay | Admitting: Medical

## 2020-04-04 ENCOUNTER — Other Ambulatory Visit: Payer: Self-pay | Admitting: Medical

## 2020-04-04 DIAGNOSIS — E559 Vitamin D deficiency, unspecified: Secondary | ICD-10-CM

## 2020-04-04 LAB — COMPREHENSIVE METABOLIC PANEL
ALT: 16 IU/L (ref 0–32)
AST: 13 IU/L (ref 0–40)
Albumin/Globulin Ratio: 1.6 (ref 1.2–2.2)
Albumin: 4.5 g/dL (ref 3.8–4.8)
Alkaline Phosphatase: 92 IU/L (ref 44–121)
BUN/Creatinine Ratio: 21 (ref 9–23)
BUN: 15 mg/dL (ref 6–20)
Bilirubin Total: 0.4 mg/dL (ref 0.0–1.2)
CO2: 23 mmol/L (ref 20–29)
Calcium: 9.2 mg/dL (ref 8.7–10.2)
Chloride: 103 mmol/L (ref 96–106)
Creatinine, Ser: 0.73 mg/dL (ref 0.57–1.00)
GFR calc Af Amer: 123 mL/min/{1.73_m2} (ref >59)
GFR calc non Af Amer: 107 mL/min/{1.73_m2} (ref >59)
Globulin, Total: 2.8 g/dL (ref 1.5–4.5)
Glucose: 99 mg/dL (ref 65–99)
Potassium: 4.4 mmol/L (ref 3.5–5.2)
Sodium: 139 mmol/L (ref 134–144)
Total Protein: 7.3 g/dL (ref 6.0–8.5)

## 2020-04-04 LAB — HEMOGLOBIN A1C
Est. average glucose Bld gHb Est-mCnc: 126 mg/dL
Hgb A1c MFr Bld: 6 % — ABNORMAL HIGH (ref 4.8–5.6)

## 2020-04-04 LAB — LIPID PANEL
Chol/HDL Ratio: 3.1 ratio (ref 0.0–4.4)
Cholesterol, Total: 172 mg/dL (ref 100–199)
HDL: 56 mg/dL (ref >39)
LDL Chol Calc (NIH): 105 mg/dL — ABNORMAL HIGH (ref 0–99)
Triglycerides: 55 mg/dL (ref 0–149)
VLDL Cholesterol Cal: 11 mg/dL (ref 5–40)

## 2020-04-04 LAB — VITAMIN D 25 HYDROXY (VIT D DEFICIENCY, FRACTURES): Vit D, 25-Hydroxy: 11.1 ng/mL — ABNORMAL LOW (ref 30.0–100.0)

## 2020-04-04 MED ORDER — VITAMIN D (ERGOCALCIFEROL) 1.25 MG (50000 UNIT) PO CAPS
50000.0000 [IU] | ORAL_CAPSULE | ORAL | 2 refills | Status: DC
  Filled 2020-06-25: qty 12, 84d supply, fill #0

## 2020-04-04 MED FILL — VIT D2 1.25 MG (50,000 UNIT: 1.25 MG | 84 days supply | Qty: 12 | Fill #0

## 2020-04-23 ENCOUNTER — Encounter: Payer: Self-pay | Admitting: Family Medicine

## 2020-04-23 ENCOUNTER — Telehealth: Payer: 59 | Admitting: Family Medicine

## 2020-04-23 VITALS — Temp 98.6°F | Wt 260.0 lb

## 2020-04-23 DIAGNOSIS — B084 Enteroviral vesicular stomatitis with exanthem: Secondary | ICD-10-CM | POA: Diagnosis not present

## 2020-04-23 NOTE — Progress Notes (Signed)
   Subjective:    Patient ID: Melissa Mason, female    DOB: 1985-07-22, 35 y.o.   MRN: [id number]  HPI Documentation for virtual audio and video telecommunications through [place] encounter: The patient was located at home. 2 patient identifiers used.  The provider was located in the office. The patient did consent to this visit and is aware of possible charges through their insurance for this visit. The other persons participating in this telemedicine service were none. Time spent on call was 5 minutes and in review of previous records >20 minutes total for counseling and coordination of care. This virtual service is not related to other E/M service within previous 7 days. Last Saturday she changed a diaper on a child subsequently was found to have hand-foot-and-mouth disease. Now she notes a pustule on her right thumb but is having no other lesions or other symptoms associated with this.  Review of Systems     Objective:   Physical Exam Alert and in no distress. She attempted to show me a picture of the thumb but I could not discern anything  significant.       Assessment & Plan:  Hand, foot and mouth disease I explained that this is usually an acute self-limited disease and the main thing is good handwashing. Recommend that she check with health at work to see what their protocol is for this and follow that. She was comfortable with it.

## 2020-05-21 ENCOUNTER — Other Ambulatory Visit: Payer: Self-pay

## 2020-05-21 ENCOUNTER — Encounter (HOSPITAL_BASED_OUTPATIENT_CLINIC_OR_DEPARTMENT_OTHER): Payer: Self-pay | Admitting: Emergency Medicine

## 2020-05-21 ENCOUNTER — Other Ambulatory Visit (HOSPITAL_COMMUNITY): Payer: Self-pay | Admitting: Emergency Medicine

## 2020-05-21 ENCOUNTER — Emergency Department (HOSPITAL_BASED_OUTPATIENT_CLINIC_OR_DEPARTMENT_OTHER)
Admission: EM | Admit: 2020-05-21 | Discharge: 2020-05-21 | Disposition: A | Discharge status: Home / Self Care | Payer: 59 | Attending: Emergency Medicine | Admitting: Emergency Medicine

## 2020-05-21 DIAGNOSIS — Z79899 Other long term (current) drug therapy: Secondary | ICD-10-CM | POA: Diagnosis not present

## 2020-05-21 DIAGNOSIS — R112 Nausea with vomiting, unspecified: Secondary | ICD-10-CM | POA: Diagnosis present

## 2020-05-21 DIAGNOSIS — J45909 Unspecified asthma, uncomplicated: Secondary | ICD-10-CM | POA: Diagnosis not present

## 2020-05-21 DIAGNOSIS — K529 Noninfective gastroenteritis and colitis, unspecified: Secondary | ICD-10-CM | POA: Diagnosis not present

## 2020-05-21 LAB — BASIC METABOLIC PANEL
Anion gap: 12 (ref 5–15)
BUN: 25 mg/dL — ABNORMAL HIGH (ref 6–20)
CO2: 23 mmol/L (ref 22–32)
Calcium: 9 mg/dL (ref 8.9–10.3)
Chloride: 100 mmol/L (ref 98–111)
Creatinine, Ser: 0.89 mg/dL (ref 0.44–1.00)
GFR, Estimated: >60 mL/min (ref >60)
Glucose, Bld: 158 mg/dL — ABNORMAL HIGH (ref 70–99)
Potassium: 3.8 mmol/L (ref 3.5–5.1)
Sodium: 135 mmol/L (ref 135–145)

## 2020-05-21 LAB — CBC WITH DIFFERENTIAL/PLATELET
Abs Immature Granulocytes: 0.11 10*3/uL — ABNORMAL HIGH (ref 0.00–0.07)
Basophils Absolute: 0.1 10*3/uL (ref 0.0–0.1)
Basophils Relative: 0 %
Eosinophils Absolute: 0.1 10*3/uL (ref 0.0–0.5)
Eosinophils Relative: 0 %
HCT: 37.2 % (ref 36.0–46.0)
Hemoglobin: 11.1 g/dL — ABNORMAL LOW (ref 12.0–15.0)
Immature Granulocytes: 0 %
Lymphocytes Relative: 2 %
Lymphs Abs: 0.6 10*3/uL — ABNORMAL LOW (ref 0.7–4.0)
MCH: 22 pg — ABNORMAL LOW (ref 26.0–34.0)
MCHC: 29.8 g/dL — ABNORMAL LOW (ref 30.0–36.0)
MCV: 73.7 fL — ABNORMAL LOW (ref 80.0–100.0)
Monocytes Absolute: 1.3 10*3/uL — ABNORMAL HIGH (ref 0.1–1.0)
Monocytes Relative: 5 %
Neutro Abs: 22.5 10*3/uL — ABNORMAL HIGH (ref 1.7–7.7)
Neutrophils Relative %: 93 %
Platelets: 544 10*3/uL — ABNORMAL HIGH (ref 150–400)
RBC: 5.05 MIL/uL (ref 3.87–5.11)
RDW: 16.4 % — ABNORMAL HIGH (ref 11.5–15.5)
WBC: 24.6 10*3/uL — ABNORMAL HIGH (ref 4.0–10.5)
nRBC: 0 % (ref 0.0–0.2)

## 2020-05-21 MED ORDER — ONDANSETRON 8 MG PO TBDP: 8.0000 mg | ORAL_TABLET | Freq: Three times a day (TID) | ORAL | 0 refills | Status: DC | PRN

## 2020-05-21 MED ORDER — LOPERAMIDE HCL 2 MG PO CAPS
4.0000 mg | ORAL_CAPSULE | Freq: Once | ORAL | Status: AC
  Administered 2020-05-21: 4 mg via ORAL
  Filled 2020-05-21: qty 2

## 2020-05-21 MED ORDER — ONDANSETRON HCL 4 MG/2ML IJ SOLN
4.0000 mg | Freq: Once | INTRAMUSCULAR | Status: AC
  Administered 2020-05-21: 4 mg via INTRAVENOUS
  Filled 2020-05-21: qty 2

## 2020-05-21 MED ORDER — LACTATED RINGERS IV BOLUS
1000.0000 mL | Freq: Once | INTRAVENOUS | Status: AC
  Administered 2020-05-21: 1000 mL via INTRAVENOUS

## 2020-05-21 NOTE — ED Provider Notes (Signed)
[facility] Provider Note: Georgena Spurling, MD, FACEP  CSN: [id number] MRN: [id number] ARRIVAL: 05/21/20 at [place]: [place]  Diarrhea and Vomiting   HISTORY OF PRESENT ILLNESS  05/21/20 3:03 AM Melissa Mason is a 35 y.o. female with 6 hours of nausea, vomiting and diarrhea.  It is severe enough that she is unable to sleep.  She has tried Pepto-Bismol and oral electrolyte replacement fluids but unable to keep them down.  She has no associated pain or fever.  She describes the diarrhea as profuse and watery but without blood.  She states her mouth is dry and she feels dehydrated.   Past Medical History:  Diagnosis Date  . Anxiety   . Asthma   . Depression   . Heartburn   . Pre-diabetes   . Reactive airway disease   . Seasonal allergies   . Vitamin D deficiency     Past Surgical History:  Procedure Laterality Date  . WISDOM TOOTH EXTRACTION      Family History  Problem Relation Age of Onset  . Depression Mother   . Obesity Mother   . Hypertension Father   . Sleep apnea Father   . Obesity Father   . Cancer Father        skin, basal  . Cancer Maternal Grandmother        breast  . Diabetes Maternal Grandmother   . Cancer Maternal Grandfather        prostate  . Cancer Paternal Grandmother        breast, lymphoma  . Alzheimer's disease Paternal Grandfather   . Diabetes Paternal Grandfather   . Heart disease Neg Hx   . Stroke Neg Hx     Social History   Tobacco Use  . Smoking status: Never Smoker  . Smokeless tobacco: Never Used  Substance Use Topics  . Alcohol use: Yes    Comment: rare  . Drug use: No    Prior to Admission medications   Medication Sig Start Date End Date Taking? Authorizing Provider  ondansetron (ZOFRAN ODT) 8 MG disintegrating tablet Take 1 tablet (8 mg total) by mouth every 8 (eight) hours as needed for nausea or vomiting. 05/21/20  Yes Peggi Yono, MD  albuterol (VENTOLIN HFA) 108 (90 Base) MCG/ACT  inhaler Inhale 2 puffs into the lungs every 6 (six) hours as needed for wheezing or shortness of breath. 04/03/20   Tysinger, Camelia Eng, PA-C  busPIRone (BUSPAR) 10 MG tablet Take 1 tablet (10 mg total) by mouth daily. 02/21/20   Tysinger, Camelia Eng, PA-C  escitalopram (LEXAPRO) 20 MG tablet Take 1 tablet (20 mg total) by mouth daily. 02/21/20   Tysinger, Camelia Eng, PA-C  Vitamin D, Ergocalciferol, (DRISDOL) 1.25 MG (50000 UNIT) CAPS capsule Take 1 capsule (50,000 Units total) by mouth every 7 (seven) days. 04/04/20   Tysinger, Camelia Eng, PA-C    Allergies Pollen extract   REVIEW OF SYSTEMS  Negative except as noted here or in the History of Present Illness.   PHYSICAL EXAMINATION  Initial Vital Signs Blood pressure (!) 149/85, pulse 82, temperature 98.4 F (36.9 C), temperature source Oral, resp. rate 18, last menstrual period 05/20/2020, SpO2 100 %.  Examination General: Well-developed, well-nourished female in no acute distress; appearance consistent with age of record HENT: normocephalic; atraumatic; dry mucous membranes Eyes: pupils equal, round and reactive to light; extraocular muscles intact Neck: supple Heart: regular rate and rhythm Lungs: clear to auscultation bilaterally Abdomen: soft; nondistended;  nontender; bowel sounds present Extremities: No deformity; full range of motion; pulses normal Neurologic: Awake, alert and oriented; motor function intact in all extremities and symmetric; no facial droop Skin: Warm and dry Psychiatric: Normal mood and affect   RESULTS  Summary of this visit's results, reviewed and interpreted by myself:   EKG Interpretation  Date/Time:    Ventricular Rate:    PR Interval:    QRS Duration:   QT Interval:    QTC Calculation:   R Axis:     Text Interpretation:        Laboratory Studies: Results for orders placed or performed during the hospital encounter of 05/21/20 (from the past 24 hour(s))  CBC with Differential/Platelet     Status:  Abnormal   Collection Time: 05/21/20  3:26 AM  Result Value Ref Range   WBC 24.6 (H) 4.0 - 10.5 K/uL   RBC 5.05 3.87 - 5.11 MIL/uL   Hemoglobin 11.1 (L) 12.0 - 15.0 g/dL   HCT 37.2 36.0 - 46.0 %   MCV 73.7 (L) 80.0 - 100.0 fL   MCH 22.0 (L) 26.0 - 34.0 pg   MCHC 29.8 (L) 30.0 - 36.0 g/dL   RDW 16.4 (H) 11.5 - 15.5 %   Platelets 544 (H) 150 - 400 K/uL   nRBC 0.0 0.0 - 0.2 %   Neutrophils Relative % 93 %   Neutro Abs 22.5 (H) 1.7 - 7.7 K/uL   Lymphocytes Relative 2 %   Lymphs Abs 0.6 (L) 0.7 - 4.0 K/uL   Monocytes Relative 5 %   Monocytes Absolute 1.3 (H) 0.1 - 1.0 K/uL   Eosinophils Relative 0 %   Eosinophils Absolute 0.1 0.0 - 0.5 K/uL   Basophils Relative 0 %   Basophils Absolute 0.1 0.0 - 0.1 K/uL   Immature Granulocytes 0 %   Abs Immature Granulocytes 0.11 (H) 0.00 - 0.07 K/uL  Basic metabolic panel     Status: Abnormal   Collection Time: 05/21/20  3:26 AM  Result Value Ref Range   Sodium 135 135 - 145 mmol/L   Potassium 3.8 3.5 - 5.1 mmol/L   Chloride 100 98 - 111 mmol/L   CO2 23 22 - 32 mmol/L   Glucose, Bld 158 (H) 70 - 99 mg/dL   BUN 25 (H) 6 - 20 mg/dL   Creatinine, Ser 0.89 0.44 - 1.00 mg/dL   Calcium 9.0 8.9 - 10.3 mg/dL   GFR, Estimated >60 >60 mL/min   Anion gap 12 5 - 15   Imaging Studies: No results found.  ED COURSE and MDM  Nursing notes, initial and subsequent vitals signs, including pulse oximetry, reviewed and interpreted by myself.  Vitals:   05/21/20 0301  BP: (!) 149/85  Pulse: 82  Resp: 18  Temp: 98.4 F (36.9 C)  TempSrc: Oral  SpO2: 100%   Medications  loperamide (IMODIUM) capsule 4 mg (has no administration in time range)  lactated ringers bolus 1,000 mL ( Intravenous Stopped 05/21/20 0432)  ondansetron (ZOFRAN) injection 4 mg (4 mg Intravenous Given 05/21/20 0331)   4:59 AM Patient feeling better after IV fluids.  She is drinking without vomiting.  Presentation is consistent with a viral gastroenteritis.  Patient has children who  recently started daycare.   PROCEDURES  Procedures   ED DIAGNOSES     ICD-10-CM   1. Gastroenteritis  K52.9        Malcomb Gangemi, MD 05/21/20 1610

## 2020-05-21 NOTE — ED Triage Notes (Signed)
Pt states ND x 3 hours tried Pepto and oral electrolytes. hasn't gotten any better can't sleep.

## 2020-05-22 ENCOUNTER — Ambulatory Visit: Payer: 59 | Admitting: Medical

## 2020-05-22 ENCOUNTER — Other Ambulatory Visit (HOSPITAL_COMMUNITY): Payer: Self-pay | Admitting: Medical

## 2020-05-22 ENCOUNTER — Encounter: Payer: Self-pay | Admitting: Medical

## 2020-05-22 VITALS — BP 130/86 | HR 96 | Temp 98.5°F | Ht 62.0 in | Wt 252.2 lb

## 2020-05-22 DIAGNOSIS — R490 Dysphonia: Secondary | ICD-10-CM

## 2020-05-22 DIAGNOSIS — R112 Nausea with vomiting, unspecified: Secondary | ICD-10-CM

## 2020-05-22 DIAGNOSIS — R197 Diarrhea, unspecified: Secondary | ICD-10-CM | POA: Diagnosis not present

## 2020-05-22 DIAGNOSIS — D72829 Elevated white blood cell count, unspecified: Secondary | ICD-10-CM

## 2020-05-22 MED ORDER — AMOXICILLIN-POT CLAVULANATE 875-125 MG PO TABS: 1.0000 | ORAL_TABLET | Freq: Two times a day (BID) | ORAL | 0 refills | Status: DC

## 2020-05-22 NOTE — Progress Notes (Signed)
Subjective:  Lenka Maultsby is a 35 y.o. female who presents for Chief Complaint  Patient presents with  . Follow-up    Hospital follow up gastro problems which has cleared up but now have upper respiratory issues      2 days ago was having some itchy ears, watery eyes, sneezing.  Didn't feel great yesterday.  Then   Was having vomiting and diarrhea on and off 4 hours yesterday afternoon.  Ended up going to the emergency due to nonstop nausea, vomiting and diarrhea.   Was given IV fluids, IV zofran.   Diarrhea and vomiting fortunately stooped yesterday evening  This morning has lost some of her voice, has some chest cold. Has some cough intermittent.  Feels wheezing.  Had low grade fever last night 100.1  No urinary change, no frequency no urgency.  Still feels dehydration.    No blood in urine or stool.  No back pain, no abdominal pain.  Has general body aches  No other aggravating or relieving factors.    No other c/o.  Past Medical History:  Diagnosis Date  . Anxiety   . Asthma   . Depression   . Heartburn   . Pre-diabetes   . Reactive airway disease   . Seasonal allergies   . Vitamin D deficiency    Current Outpatient Medications on File Prior to Visit  Medication Sig Dispense Refill  . busPIRone (BUSPAR) 10 MG tablet Take 1 tablet (10 mg total) by mouth daily. 90 tablet 0  . escitalopram (LEXAPRO) 20 MG tablet Take 1 tablet (20 mg total) by mouth daily. 90 tablet 0  . Vitamin D, Ergocalciferol, (DRISDOL) 1.25 MG (50000 UNIT) CAPS capsule Take 1 capsule (50,000 Units total) by mouth every 7 (seven) days. 12 capsule 2  . albuterol (VENTOLIN HFA) 108 (90 Base) MCG/ACT inhaler Inhale 2 puffs into the lungs every 6 (six) hours as needed for wheezing or shortness of breath. (Patient not taking: Reported on 05/22/2020) 18 g 0  . ondansetron (ZOFRAN ODT) 8 MG disintegrating tablet Take 1 tablet (8 mg total) by mouth every 8 (eight) hours as needed for nausea or vomiting. (Patient  not taking: Reported on 05/22/2020) 10 tablet 0   No current facility-administered medications on file prior to visit.    The following portions of the patient's history were reviewed and updated as appropriate: allergies, current medications, past family history, past medical history, past social history, past surgical history and problem list.  ROS Otherwise as in subjective above    Objective: BP 130/86   Pulse 96   Temp 98.5 F (36.9 C)   Ht 5\' 2"  (1.575 m)   Wt 252 lb 3.2 oz (114.4 kg)   LMP 05/20/2020   SpO2 97%   BMI 46.13 kg/m    Wt Readings from Last 3 Encounters:  05/22/20 252 lb 3.2 oz (114.4 kg)  04/23/20 260 lb (117.9 kg)  04/03/20 260 lb 6.4 oz (118.1 kg)   General appearance: alert, no distress, well developed, well nourished, mildly hoarse voice HEENT: normocephalic, sclerae anicteric, conjunctiva pink and moist, TMs pearly, nares patent, no discharge or erythema, pharynx normal Oral cavity: somewhat dry MM, no lesions Neck: supple, no lymphadenopathy, no thyromegaly, no masses Heart: RRR, normal S1, S2, no murmurs Lungs: CTA bilaterally, no wheezes, rhonchi, or rales Abdomen: +bs, soft, non tender, non distended, no masses, no hepatomegaly, no splenomegaly Pulses: 2+ radial pulses, 2+ pedal pulses, normal cap refill Ext: no edema    Assessment:  Encounter Diagnoses  Name Primary?  . Leukocytosis, unspecified type Yes  . Nausea and vomiting, intractability of vomiting not specified, unspecified vomiting type   . Hoarse voice quality   . Diarrhea, unspecified type      Plan: I reviewed her hospital ED report, labs.  Leukocytosis noted.  concerned for possible aspiration pneumonia.  Begin Augmentin, rest, hydrate well as discussed.  Nausea, vomiting and diarrhea resolved.  Patient Instructions  You have hoarse voice most likely due to all the vomiting.   Use voice rest, warm fluids like tea or honey/lemon in hot water.  The hoarse voice will  gradually resolve over the next few days  Given all the vomiting, there is the potential that you may have aspirated some vomit.  Begin Augmentin antibiotic Begin over the counter probiotic such Align, IB Guard, Activia yogurt or Acidophilus for example Rest Drink plenty of clear fluids such as water, G2 Gatorade, soup, etc.  Avoid acid foods for now for the next week as this would just worsen the hoarse voice  If worsening in the next 72 hours such as fever over 101, shortness of breath, worsening vomiting, coughing up blood, etc, then call, recheck, or go to the emergency dept.  I would recommend a repeat CBC in 10-12 days assuming you are much improved       Maddalyn was seen today for follow-up.  Diagnoses and all orders for this visit:  Leukocytosis, unspecified type -     CBC with Differential/Platelet; Future  Nausea and vomiting, intractability of vomiting not specified, unspecified vomiting type -     CBC with Differential/Platelet; Future  Hoarse voice quality  Diarrhea, unspecified type  Other orders -     amoxicillin-clavulanate (AUGMENTIN) 875-125 MG tablet; Take 1 tablet by mouth 2 (two) times daily.    Follow up: repeat cbc in 7-10 days

## 2020-05-22 NOTE — Patient Instructions (Signed)
You have hoarse voice most likely due to all the vomiting.   Use voice rest, warm fluids like tea or honey/lemon in hot water.  The hoarse voice will gradually resolve over the next few days  Given all the vomiting, there is the potential that you may have aspirated some vomit.  Begin Augmentin antibiotic Begin over the counter probiotic such Align, IB Guard, Activia yogurt or Acidophilus for example Rest Drink plenty of clear fluids such as water, G2 Gatorade, soup, etc.  Avoid acid foods for now for the next week as this would just worsen the hoarse voice  If worsening in the next 72 hours such as fever over 101, shortness of breath, worsening vomiting, coughing up blood, etc, then call, recheck, or go to the emergency dept.  I would recommend a repeat CBC in 10-12 days assuming you are much improved

## 2020-06-03 ENCOUNTER — Other Ambulatory Visit: Payer: 59

## 2020-06-03 ENCOUNTER — Other Ambulatory Visit: Payer: Self-pay

## 2020-06-03 DIAGNOSIS — D72829 Elevated white blood cell count, unspecified: Secondary | ICD-10-CM | POA: Diagnosis not present

## 2020-06-03 DIAGNOSIS — R112 Nausea with vomiting, unspecified: Secondary | ICD-10-CM | POA: Diagnosis not present

## 2020-06-03 LAB — CBC WITH DIFFERENTIAL/PLATELET
Basophils Absolute: 0.1 10*3/uL (ref 0.0–0.2)
Basos: 1 %
EOS (ABSOLUTE): 0.2 10*3/uL (ref 0.0–0.4)
Eos: 2 %
Hematocrit: 32.5 % — ABNORMAL LOW (ref 34.0–46.6)
Hemoglobin: 9.5 g/dL — ABNORMAL LOW (ref 11.1–15.9)
Immature Grans (Abs): 0 10*3/uL (ref 0.0–0.1)
Immature Granulocytes: 1 %
Lymphocytes Absolute: 2 10*3/uL (ref 0.7–3.1)
Lymphs: 23 %
MCH: 20.9 pg — ABNORMAL LOW (ref 26.6–33.0)
MCHC: 29.2 g/dL — ABNORMAL LOW (ref 31.5–35.7)
MCV: 72 fL — ABNORMAL LOW (ref 79–97)
Monocytes Absolute: 0.7 10*3/uL (ref 0.1–0.9)
Monocytes: 8 %
Neutrophils Absolute: 5.6 10*3/uL (ref 1.4–7.0)
Neutrophils: 65 %
Platelets: 497 10*3/uL — ABNORMAL HIGH (ref 150–450)
RBC: 4.54 x10E6/uL (ref 3.77–5.28)
RDW: 15.5 % — ABNORMAL HIGH (ref 11.7–15.4)
WBC: 8.5 10*3/uL (ref 3.4–10.8)

## 2020-06-04 ENCOUNTER — Other Ambulatory Visit: Payer: Self-pay | Admitting: Medical

## 2020-06-04 ENCOUNTER — Other Ambulatory Visit (HOSPITAL_COMMUNITY): Payer: Self-pay | Admitting: Medical

## 2020-06-04 MED ORDER — ESCITALOPRAM OXALATE 20 MG PO TABS: 20.0000 mg | ORAL_TABLET | Freq: Every day | ORAL | 0 refills | Status: DC

## 2020-06-04 MED ORDER — BUSPIRONE HCL 10 MG PO TABS: 10.0000 mg | ORAL_TABLET | Freq: Every day | ORAL | 0 refills | Status: DC

## 2020-06-04 MED ORDER — FERROUS GLUCONATE 324 (38 FE) MG PO TABS
324.0000 mg | ORAL_TABLET | Freq: Two times a day (BID) | ORAL | 2 refills | Status: DC
Start: 2020-06-04 — End: 2020-10-08

## 2020-06-14 ENCOUNTER — Encounter: Payer: Self-pay | Admitting: Medical

## 2020-06-25 ENCOUNTER — Other Ambulatory Visit: Payer: Self-pay

## 2020-06-25 ENCOUNTER — Ambulatory Visit: Payer: 59 | Admitting: Family Medicine

## 2020-06-25 ENCOUNTER — Other Ambulatory Visit: Payer: Self-pay | Admitting: Medical

## 2020-06-25 ENCOUNTER — Other Ambulatory Visit (HOSPITAL_COMMUNITY): Payer: Self-pay

## 2020-06-25 ENCOUNTER — Encounter: Payer: Self-pay | Admitting: Family Medicine

## 2020-06-25 VITALS — BP 138/82 | HR 89 | Temp 99.1°F | Wt 255.4 lb

## 2020-06-25 DIAGNOSIS — H66003 Acute suppurative otitis media without spontaneous rupture of ear drum, bilateral: Secondary | ICD-10-CM

## 2020-06-25 DIAGNOSIS — J301 Allergic rhinitis due to pollen: Secondary | ICD-10-CM | POA: Diagnosis not present

## 2020-06-25 DIAGNOSIS — J4520 Mild intermittent asthma, uncomplicated: Secondary | ICD-10-CM | POA: Diagnosis not present

## 2020-06-25 MED ORDER — MONTELUKAST SODIUM 10 MG PO TABS
10.0000 mg | ORAL_TABLET | Freq: Every day | ORAL | 1 refills | Status: DC
  Filled 2020-06-25: qty 90, 90d supply, fill #0
  Filled 2020-11-07: qty 90, 90d supply, fill #1

## 2020-06-25 MED ORDER — ALBUTEROL SULFATE HFA 108 (90 BASE) MCG/ACT IN AERS
2.0000 | INHALATION_SPRAY | Freq: Four times a day (QID) | RESPIRATORY_TRACT | 1 refills | Status: DC | PRN
  Filled 2020-06-25: qty 18, 25d supply, fill #0

## 2020-06-25 MED ORDER — AMOXICILLIN 875 MG PO TABS
875.0000 mg | ORAL_TABLET | Freq: Two times a day (BID) | ORAL | 0 refills | Status: DC
Start: 2020-06-25 — End: 2020-07-03
  Filled 2020-06-25: qty 20, 10d supply, fill #0

## 2020-06-25 NOTE — Progress Notes (Signed)
   Subjective:    Patient ID: Melissa Mason, female    DOB: [birth year], 35 y.o.   MRN: [id number]  HPI She has seasonal allergies with sneezing, itchy watery eyes, rhinorrhea and sinus pressure.  She has had bilateral ear pressure for the last week however yesterday she noted more right ear pain.  She continues on Zyrtec and Flonase as well as occasional use of an inhaler.   Review of Systems     Objective:   Physical Exam Alert and in no distress.  Right TM is red, left is dull and vascular.  Throat is clear.  Neck is supple without adenopathy.       Assessment & Plan:  Non-recurrent acute suppurative otitis media of both ears without spontaneous rupture of tympanic membranes - Plan: amoxicillin (AMOXIL) 875 MG tablet  Mild intermittent asthma, unspecified whether complicated - Plan: albuterol (VENTOLIN HFA) 108 (90 Base) MCG/ACT inhaler  Seasonal allergic rhinitis due to pollen - Plan: montelukast (SINGULAIR) 10 MG tablet I will add Singulair to her allergy regimen to help with her symptoms since they are not under good control.  We will also renew her Ventolin as she does use this mainly during the allergy season.  She will call if continued difficulty with ear pain.

## 2020-06-26 ENCOUNTER — Other Ambulatory Visit (HOSPITAL_COMMUNITY): Payer: Self-pay

## 2020-07-01 ENCOUNTER — Telehealth: Payer: Self-pay | Admitting: Medical

## 2020-07-01 NOTE — Telephone Encounter (Signed)
Schedule her to see [facility] tomorrow

## 2020-07-01 NOTE — Telephone Encounter (Signed)
Still having a lot of problems with her ears  Feeling pressure, muffled hearing, she is worse since her last appointment

## 2020-07-02 ENCOUNTER — Other Ambulatory Visit: Payer: Self-pay | Admitting: Medical

## 2020-07-02 ENCOUNTER — Other Ambulatory Visit (HOSPITAL_COMMUNITY): Payer: Self-pay

## 2020-07-02 ENCOUNTER — Ambulatory Visit: Payer: 59 | Admitting: Medical

## 2020-07-02 ENCOUNTER — Encounter: Payer: Self-pay | Admitting: Medical

## 2020-07-02 ENCOUNTER — Other Ambulatory Visit: Payer: Self-pay

## 2020-07-02 ENCOUNTER — Other Ambulatory Visit (HOSPITAL_BASED_OUTPATIENT_CLINIC_OR_DEPARTMENT_OTHER): Payer: Self-pay

## 2020-07-02 VITALS — BP 132/88 | HR 83 | Ht 62.0 in | Wt 256.2 lb

## 2020-07-02 DIAGNOSIS — E559 Vitamin D deficiency, unspecified: Secondary | ICD-10-CM | POA: Diagnosis not present

## 2020-07-02 DIAGNOSIS — N921 Excessive and frequent menstruation with irregular cycle: Secondary | ICD-10-CM | POA: Diagnosis not present

## 2020-07-02 DIAGNOSIS — J4520 Mild intermittent asthma, uncomplicated: Secondary | ICD-10-CM | POA: Diagnosis not present

## 2020-07-02 DIAGNOSIS — H6690 Otitis media, unspecified, unspecified ear: Secondary | ICD-10-CM | POA: Insufficient documentation

## 2020-07-02 DIAGNOSIS — J301 Allergic rhinitis due to pollen: Secondary | ICD-10-CM | POA: Diagnosis not present

## 2020-07-02 DIAGNOSIS — D500 Iron deficiency anemia secondary to blood loss (chronic): Secondary | ICD-10-CM

## 2020-07-02 MED ORDER — PREDNISONE 20 MG PO TABS
40.0000 mg | ORAL_TABLET | Freq: Every day | ORAL | 0 refills | Status: DC
  Filled 2020-07-02 (×2): qty 8, 4d supply, fill #0

## 2020-07-02 MED ORDER — AMOXICILLIN-POT CLAVULANATE 875-125 MG PO TABS
1.0000 | ORAL_TABLET | Freq: Two times a day (BID) | ORAL | 0 refills | Status: DC
  Filled 2020-07-02 (×2): qty 20, 10d supply, fill #0

## 2020-07-02 NOTE — Progress Notes (Signed)
Subjective:  Melissa Mason is a 35 y.o. female who presents for Chief Complaint  Patient presents with  . Ear Problem    Still have ear pain after double ear infection      Here for f/u on several concerns.  Been having pain and pressure and ears, saw Dr. Redmond School here last week for ear infection.   still has 3 days left on antibiotic.  Has improvement but still feels a lot of pressure still in ears.  Was also started on Singulair at that time.  Using zyrtec and Flonase as well, ibuprofen for pain.     Here for follow-up of vitamin D deficiency.  She has been compliant with vitamin D for the last 2 months since her low lab reading.  Here to follow-up on anemia.  She notes heavy and irregular periods longstanding.  She would like referral to gynecology for further eval and treatment.  Not currently on treatment for this.  She started iron as prescribed in March however she is only taking it during her periods and not daily.  She feels strongly her anemia is related to heavy periods.  No blood in the stool.  No other bruising or bleeding.  Her mother has a history of PCOS  Asthma-has only had to use albuterol inhaler few times since last visit.  Doing overall pretty well with breathing  No other aggravating or relieving factors.    No other c/o.  Past Medical History:  Diagnosis Date  . Anxiety   . Asthma   . Depression   . Heartburn   . Pre-diabetes   . Reactive airway disease   . Seasonal allergies   . Vitamin D deficiency    Current Outpatient Medications on File Prior to Visit  Medication Sig Dispense Refill  . albuterol (VENTOLIN HFA) 108 (90 Base) MCG/ACT inhaler INHALE 2 PUFFS INTO THE LUNGS EVERY 6 (SIX) HOURS AS NEEDED FOR WHEEZING OR SHORTNESS OF BREATH. 18 g 1  . busPIRone (BUSPAR) 10 MG tablet Take 1 tablet (10 mg total) by mouth daily. 90 tablet 0  . busPIRone (BUSPAR) 10 MG tablet TAKE 1 TABLET (10 MG TOTAL) BY MOUTH DAILY. 90 tablet 0  . escitalopram (LEXAPRO) 20  MG tablet Take 1 tablet (20 mg total) by mouth daily. 90 tablet 0  . escitalopram (LEXAPRO) 20 MG tablet TAKE 1 TABLET (20 MG TOTAL) BY MOUTH DAILY. 90 tablet 0  . escitalopram (LEXAPRO) 20 MG tablet TAKE 1 TABLET (20 MG TOTAL) BY MOUTH DAILY. 90 tablet 0  . ferrous gluconate (FERGON) 324 MG tablet Take 1 tablet (324 mg total) by mouth 2 (two) times daily with a meal. 60 tablet 2  . montelukast (SINGULAIR) 10 MG tablet Take 1 tablet (10 mg total) by mouth at bedtime. 90 tablet 1  . amoxicillin (AMOXIL) 875 MG tablet Take 1 tablet (875 mg total) by mouth 2 (two) times daily. (Patient not taking: Reported on 07/02/2020) 20 tablet 0  . ondansetron (ZOFRAN ODT) 8 MG disintegrating tablet Take 1 tablet (8 mg total) by mouth every 8 (eight) hours as needed for nausea or vomiting. (Patient not taking: No sig reported) 10 tablet 0   No current facility-administered medications on file prior to visit.     The following portions of the patient's history were reviewed and updated as appropriate: allergies, current medications, past family history, past medical history, past social history, past surgical history and problem list.  ROS Otherwise as in subjective above  Objective:  BP 132/88   Pulse 83   Ht 5\' 2"  (1.575 m)   Wt 256 lb 3.2 oz (116.2 kg)   SpO2 98%   BMI 46.86 kg/m   General appearance: alert, no distress, well developed, well nourished HEENT: normocephalic, sclerae anicteric, conjunctiva pink and moist, TMs with mild erythema, bubbles behind the TM bilaterally from effusion, otherwise nares patent, no discharge or erythema, pharynx normal Oral cavity: MMM, no lesions Neck: supple, no lymphadenopathy, no thyromegaly, no masses     Assessment: Encounter Diagnoses  Name Primary?  . Acute otitis media, unspecified otitis media type Yes  . Vitamin D deficiency   . Menorrhagia with irregular cycle   . Mild intermittent asthma, unspecified whether complicated   . Seasonal allergic  rhinitis due to pollen   . Iron deficiency anemia due to chronic blood loss       Plan: Otitis media-stop amoxicillin and change to medication as below, continue allergy regimen, increase water intake, consider nasal saline flush.  Recheck if not resolved within the next week  Vitamin D deficiency-continue supplement, labs today since she has been on medicine about 3 months now  Menorrhagia, anemia- referral to gynecology  Intermittent asthma-controlled with as needed use of albuterol currently and Singulair  Anemia- I recommend she take iron daily not just during her period.  If not tolerating ferrous gluconate we can try changing to tandem.  Labs as below  Joette was seen today for ear problem.  Diagnoses and all orders for this visit:  Acute otitis media, unspecified otitis media type  Vitamin D deficiency -     VITAMIN D 25 Hydroxy (Vit-D Deficiency, Fractures)  Menorrhagia with irregular cycle -     Ambulatory referral to Gynecology  Mild intermittent asthma, unspecified whether complicated  Seasonal allergic rhinitis due to pollen  Iron deficiency anemia due to chronic blood loss -     Iron and TIBC -     CBC -     Ambulatory referral to Gynecology  Other orders -     amoxicillin-clavulanate (AUGMENTIN) 875-125 MG tablet; Take 1 tablet by mouth 2 (two) times daily. -     predniSONE (DELTASONE) 20 MG tablet; Take 2 tablets (40 mg total) by mouth daily with breakfast.    Follow up: pending labs, referral

## 2020-07-02 NOTE — Patient Instructions (Addendum)
Using Saline Nose Drops with Bulb Syringe A bulb syringe is used to clear your nose. You may use it when you have a stuffy nose, nasal congestion, sinus pressure, or sneezing.   SALINE SOLUTION You can buy nose drops at your local drug store. You can also make nose drops yourself. Mix 1 cup of water with  teaspoon of salt. Stir. Store this mixture at room temperature. Make a new batch daily.  USE THE BULB IN COMBINATION WITH SALINE NOSE DROPS  Squeeze the air out of the bulb before suctioning the saline mixture.  While still squeezing the bulb flat, place the tip of the bulb into the saline mixture.  Let air come back into the bulb.  This will suction up the saline mixture.  Gently flush one nostril at a time.  Salt water nose drops will then moisten your  congested nose and loosen secretions before suctioning.  Use the bulb syringe as directed below to suction.  USING THE BULB SYRINGE TO SUCTION  While still squeezing the bulb flat, place the tip of the bulb into a nostril. Let air come back into the bulb. The suction will pull snot out of the nose and into the bulb.  Repeat on the other nostril.  Squeeze syringe several times into a tissue.  CLEANING THE BULB SYRINGE  Clean the bulb syringe every day with hot soapy water.  Clean the inside of the bulb by squeezing the bulb while the tip is in soapy water.  Rinse by squeezing the bulb while the tip is in clean hot water.  Store the bulb with the tip side down on paper towel.  HOME CARE INSTRUCTIONS   Use saline nose drops often to keep the nose open and not stuffy.  Throw away used salt water. Make a new solution every time.  Do not use the same solution and dropper for another person  If you do not prefer to use nasal saline flush, other options include nasal saline spray or the [employer], both of which are available over the counter at your  pharmacy.     [email or web address]  Dr. [facility] for gyn referral

## 2020-07-02 NOTE — Progress Notes (Signed)
Done

## 2020-07-03 ENCOUNTER — Other Ambulatory Visit (HOSPITAL_COMMUNITY): Payer: Self-pay

## 2020-07-03 ENCOUNTER — Other Ambulatory Visit (HOSPITAL_BASED_OUTPATIENT_CLINIC_OR_DEPARTMENT_OTHER): Payer: Self-pay

## 2020-07-03 ENCOUNTER — Other Ambulatory Visit: Payer: Self-pay | Admitting: Medical

## 2020-07-03 LAB — CBC
Hematocrit: 33.3 % — ABNORMAL LOW (ref 34.0–46.6)
Hemoglobin: 10.2 g/dL — ABNORMAL LOW (ref 11.1–15.9)
MCH: 22.1 pg — ABNORMAL LOW (ref 26.6–33.0)
MCHC: 30.6 g/dL — ABNORMAL LOW (ref 31.5–35.7)
MCV: 72 fL — ABNORMAL LOW (ref 79–97)
Platelets: 506 10*3/uL — ABNORMAL HIGH (ref 150–450)
RBC: 4.62 x10E6/uL (ref 3.77–5.28)
RDW: 16.5 % — ABNORMAL HIGH (ref 11.7–15.4)
WBC: 10.5 10*3/uL (ref 3.4–10.8)

## 2020-07-03 LAB — IRON AND TIBC
Iron Saturation: 5 % — CL (ref 15–55)
Iron: 20 ug/dL — ABNORMAL LOW (ref 27–159)
Total Iron Binding Capacity: 375 ug/dL (ref 250–450)
UIBC: 355 ug/dL (ref 131–425)

## 2020-07-03 LAB — VITAMIN D 25 HYDROXY (VIT D DEFICIENCY, FRACTURES): Vit D, 25-Hydroxy: 31.3 ng/mL (ref 30.0–100.0)

## 2020-07-03 MED ORDER — VITAMIN D (ERGOCALCIFEROL) 1.25 MG (50000 UNIT) PO CAPS
50000.0000 [IU] | ORAL_CAPSULE | ORAL | 3 refills | Status: DC
  Filled 2020-07-03 (×2): qty 12, 84d supply, fill #0
  Filled 2020-10-31: qty 12, 84d supply, fill #1

## 2020-08-07 ENCOUNTER — Other Ambulatory Visit (HOSPITAL_COMMUNITY): Payer: Self-pay

## 2020-08-07 MED ORDER — BUSPIRONE HCL 10 MG PO TABS
10.0000 mg | ORAL_TABLET | Freq: Every day | ORAL | 0 refills | Status: DC
  Filled 2020-08-07: qty 90, 90d supply, fill #0

## 2020-08-07 MED ORDER — ESCITALOPRAM OXALATE 20 MG PO TABS
20.0000 mg | ORAL_TABLET | Freq: Every day | ORAL | 0 refills | Status: DC
Start: 2020-08-07 — End: 2020-10-31
  Filled 2020-08-07: qty 90, 90d supply, fill #0

## 2020-08-08 ENCOUNTER — Other Ambulatory Visit (HOSPITAL_COMMUNITY): Payer: Self-pay

## 2020-08-14 ENCOUNTER — Other Ambulatory Visit: Payer: Self-pay

## 2020-08-14 ENCOUNTER — Ambulatory Visit: Payer: 59 | Admitting: Obstetrics and Gynecology

## 2020-08-14 ENCOUNTER — Encounter: Payer: Self-pay | Admitting: Obstetrics and Gynecology

## 2020-08-14 ENCOUNTER — Other Ambulatory Visit (HOSPITAL_COMMUNITY): Payer: Self-pay

## 2020-08-14 ENCOUNTER — Other Ambulatory Visit (HOSPITAL_COMMUNITY)
Admission: RE | Admit: 2020-08-14 | Discharge: 2020-08-14 | Disposition: A | Discharge status: Home / Self Care | Payer: 59 | Source: Ambulatory Visit | Attending: Obstetrics and Gynecology | Admitting: Obstetrics and Gynecology

## 2020-08-14 VITALS — BP 132/78 | HR 95 | Ht 61.5 in | Wt 254.0 lb

## 2020-08-14 DIAGNOSIS — Z6841 Body Mass Index (BMI) 40.0 and over, adult: Secondary | ICD-10-CM

## 2020-08-14 DIAGNOSIS — N921 Excessive and frequent menstruation with irregular cycle: Secondary | ICD-10-CM | POA: Diagnosis not present

## 2020-08-14 DIAGNOSIS — L680 Hirsutism: Secondary | ICD-10-CM

## 2020-08-14 DIAGNOSIS — N915 Oligomenorrhea, unspecified: Secondary | ICD-10-CM | POA: Diagnosis not present

## 2020-08-14 DIAGNOSIS — E282 Polycystic ovarian syndrome: Secondary | ICD-10-CM

## 2020-08-14 MED ORDER — MEDROXYPROGESTERONE ACETATE 10 MG PO TABS
ORAL_TABLET | ORAL | 0 refills | Status: DC
  Filled 2020-08-14: qty 20, 10d supply, fill #0
  Filled 2020-08-14: qty 20, 15d supply, fill #0
  Filled 2020-08-16: qty 20, 20d supply, fill #0

## 2020-08-14 NOTE — Patient Instructions (Addendum)
Polycystic Ovary Syndrome  Polycystic ovarian syndrome (PCOS) is a common hormonal disorder among women of reproductive age. In most women with PCOS, small fluid-filled sacs (cysts) grow on the ovaries. PCOS can cause problems with menstrual periods and make it hard to get and stay pregnant. If this condition is not treated, it can lead to serious health problems, such as diabetes and heart disease. What are the causes? The cause of this condition is not known. It may be due to certain factors, such as:  Irregular menstrual cycle.  High levels of certain hormones.  Problems with the hormone that helps to control blood sugar (insulin).  Certain genes. What increases the risk? You are more likely to develop this condition if you:  Have a family history of PCOS or type 2 diabetes.  Are overweight, eat unhealthy foods, and are not active. These factors may cause problems with blood sugar control, which can contribute to PCOS or PCOS symptoms. What are the signs or symptoms? Symptoms of this condition include:  Ovarian cysts and sometimes pelvic pain.  Menstrual periods that are not regular or are too heavy.  Inability to get or stay pregnant.  Increased growth of hair on the face, chest, stomach, back, thumbs, thighs, or toes.  Acne or oily skin. Acne may develop during adulthood, and it may not get better with treatment.  Weight gain or obesity.  Patches of thickened and dark brown or black skin on the neck, arms, breasts, or thighs. How is this diagnosed? This condition is diagnosed based on:  Your medical history.  A physical exam that includes a pelvic exam. Your health care provider may look for areas of increased hair growth on your skin.  Tests, such as: ? An ultrasound to check the ovaries for cysts and to view the lining of the uterus. ? Blood tests to check levels of sugar (glucose), female hormone (testosterone), and female hormones (estrogen and progesterone). How  is this treated? There is no cure for this condition, but treatment can help to manage symptoms and prevent more health problems from developing. Treatment varies depending on your symptoms and if you want to have a baby or if you need birth control. Treatment may include:  Making nutrition and lifestyle changes.  Taking the progesterone hormone to start a menstrual period.  Taking birth control pills to help you have regular menstrual periods.  Taking medicines such as: ? Medicines to make you ovulate, if you want to get pregnant. ? Medicine to reduce extra hair growth.  Having surgery in severe cases. This may involve making small holes in one or both of your ovaries. This decreases the amount of testosterone that your body makes. Follow these instructions at home:  Take over-the-counter and prescription medicines only as told by your health care provider.  Follow a healthy meal plan that includes lean proteins, complex carbohydrates, fresh fruits and vegetables, low-fat dairy products, healthy fats, and fiber.  If you are overweight, lose weight as told by your health care provider. Your health care provider can determine how much weight loss is best for you and can help you lose weight safely.  Keep all follow-up visits. This is important. Contact a health care provider if:  Your symptoms do not get better with medicine.  Your symptoms get worse or you develop new symptoms. Summary  Polycystic ovarian syndrome (PCOS) is a common hormonal disorder among women of reproductive age.  PCOS can cause problems with menstrual periods and make it hard  to get and stay pregnant.  If this condition is not treated, it can lead to serious health problems, such as diabetes and heart disease.  There is no cure for this condition, but treatment can help to manage symptoms and prevent more health problems from developing. This information is not intended to replace advice given to you by your  health care provider. Make sure you discuss any questions you have with your health care provider. Document Revised: 08/03/2019 Document Reviewed: 08/03/2019 Elsevier Patient Education  2021 [place]. Oral Contraception Information Oral contraceptive pills (OCPs) are medicines taken by mouth to prevent pregnancy. They work by:  Preventing the ovaries from releasing eggs.  Thickening mucus in the lower part of the uterus (cervix). This prevents sperm from entering the uterus.  Thinning the lining of the uterus (endometrium). This prevents a fertilized egg from attaching to the endometrium. OCPs are highly effective when taken exactly as prescribed. However, OCPs do not prevent STIs (sexually transmitted infections). Using condoms while on an OCP can help prevent STIs. What happens before starting OCPs? Before you start taking OCPs:  You may have a physical exam, blood test, and Pap test.  Your health care provider will make sure you are a good candidate for oral contraception. OCPs are not a good option for certain women, such as: ? Women who smoke and are older than age 64. ? Women who have or have had certain conditions, such as:  A history of high blood pressure.  Deep vein thrombosis.  Pulmonary embolism.  Stroke.  Cardiovascular disease.  Peripheral vascular disease. Ask your health care provider about the possible side effects of the OCP you may be prescribed. Be aware that it can take 2-3 months for your body to adjust to changes in hormone levels. Types of oral contraception Birth control pills contain the hormones estrogen and progestin (synthetic progesterone) or progestin only. The combination pill This type of pill contains estrogen and progestin hormones.  Conventional contraception pills come in packs of 21 or 28 pills. ? Some packs with 28-day pills contain estrogen and progestin for the first 21-24 days. Hormone-free tablets, called placebos, are taken for  the final 4-7 days. You should have menstrual bleeding during the time you take the placebos. ? In packs with 21 tablets, you take no pills for 7 days. Menstrual bleeding occurs during these days. (Some people prefer taking a pill for 28 days to help establish a routine).  Extended-interval contraception pills come in packs of 91 pills. The first 84 tablets have both estrogen and progestin. The last 7 pills are placebos. Menstrual bleeding occurs during the placebo days. With this schedule, menstrual bleeding happens once every 3 months.  Continuous contraception pills come in packs of 28 pills. All pills in the pack contain estrogen and progestin. With this schedule, regular menstrual bleeding does not happen, but there may be spotting or irregular bleeding. Progestin-only pills This type of pill is often called the mini-pill and contains the progestin hormone only. It comes in packs of 28 pills. In some packs, the last 4 pills are placebos. The pill must be taken at the same time every day. This is very important to prevent pregnancy. Menstrual bleeding may not be regular or predictable.   What are the advantages? Oral contraception provides reliable and continuous contraception if taken as directed. It may treat or decrease symptoms of:  Menstrual period cramps.  Irregular menstrual cycle or bleeding.  Heavy menstrual flow.  Abnormal uterine bleeding.  Acne, depending on the type of pill.  Polycystic ovarian syndrome (POS).  Endometriosis.  Iron deficiency anemia.  Premenstrual symptoms, including severe irritability, depression, or anxiety. It also may:  Reduce the risk of endometrial and ovarian cancer.  Be used as emergency contraception.  Prevent ectopic pregnancies and infections of the fallopian tubes. What can make OCPs less effective? OCPs may be less effective if:  You forget to take the pill every day. For progestin-only pills, it is especially important to take the  pill at the same time each day. Even taking it 3 hours late can increase the risk of pregnancy.  You have a stomach or intestinal disease that reduces your body's ability to absorb the pill.  You take OCPs with other medicines that make OCPs less effective, such as antibiotics, certain HIV medicines, and some seizure medicines.  You take expired OCPs.  You forget to restart the pill after 7 days of not taking it. This refers to the packs of 21 pills. What are the side effects and risks? OCPs can sometimes cause side effects, such as:  Headache.  Depression.  Trouble sleeping.  Nausea and vomiting.  Breast tenderness.  Irregular bleeding or spotting during the first several months.  Bloating or fluid retention.  Increase in blood pressure. Combination pills may slightly increase the risk of:  Blood clots.  Heart attack.  Stroke. Follow these instructions at home: Follow instructions from your health care provider about how to start taking your first cycle of OCPs. Depending on when you start the pill, you may need to use a backup form of birth control, such as condoms, during the first week. Make sure you know what steps to take if you forget to take the pill. Summary  Oral contraceptive pills (OCPs) are medicines taken by mouth to prevent pregnancy. They are highly effective when taken exactly as prescribed.  OCPs contain a combination of the hormones estrogen and progestin (synthetic progesterone) or progestin only.  Before you start taking the pill, you may have a physical exam, blood test, and Pap test. Your health care provider will make sure you are a good candidate for oral contraception.  The combination pill may come in a 21-day pack, a 28-day pack, or a 91-day pack. Progestin-only pills come in packs of 28 pills.  OCPs can sometimes cause side effects, such as headache, nausea, breast tenderness, or irregular bleeding. This information is not intended to  replace advice given to you by your health care provider. Make sure you discuss any questions you have with your health care provider. Document Revised: 11/24/2019 Document Reviewed: 11/02/2019 Elsevier Patient Education  [place].

## 2020-08-14 NOTE — Progress Notes (Signed)
35 y.o. [place] Married White or Caucasian Not Hispanic or Latino female here for a consultation from [place], [place] for abnormal uterine bleeding and iron deficiency. Patient states that the bleeding started about two months ago and has been constant. She states that prior to this she has always had irregular menses.  Menarche at 18, always irregular. Menses range from every 1-3 months. She typically bleeds for 10-14 days. Can saturate a pad in up to 2 hours.  No cramps.  Her current cycle started 2 months ago, she has been bleeding daily for the last 2 months. Heavy off and on. Flow varies, at times her saturates a pad in 4 hours. No cramps.  Married to a Woman.   She has some hair growth on her chin and around her nipples. No current issues with acne.     No LMP recorded.          Sexually active: Yes.    Female partner The current method of family planning is none.    Exercising: No.  none  Smoker:  no  Health Maintenance: Pap:  10/27/17  WNL HPV Neg ([place]) History of abnormal Pap:  no MMG:  None  BMD:   NA Colonoscopy: none  TDaP:  05/12/2010 Gardasil: none    reports that she has never smoked. She has never used smokeless tobacco. She reports current alcohol use. She reports that she does not use drugs. 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 She and her wife have 2 children, 3 and 1.5 years. Her wife gave birth to them.   Past Medical History:  Diagnosis Date  . Anxiety   . Asthma   . Depression   . Heartburn   . Pre-diabetes   . Reactive airway disease   . Seasonal allergies   . Vitamin D deficiency     Past Surgical History:  Procedure Laterality Date  . WISDOM TOOTH EXTRACTION      Current Outpatient Medications  Medication Sig Dispense Refill  . albuterol (VENTOLIN HFA) 108 (90 Base) MCG/ACT inhaler INHALE 2 PUFFS INTO THE LUNGS EVERY 6 (SIX) HOURS AS NEEDED FOR WHEEZING OR SHORTNESS OF BREATH. 18 g 1  . busPIRone (BUSPAR) 10 MG tablet Take 1  tablet (10 mg total) by mouth daily. 90 tablet 0  . escitalopram (LEXAPRO) 20 MG tablet Take 1 tablet (20 mg total) by mouth daily. 90 tablet 0  . ferrous gluconate (FERGON) 324 MG tablet Take 1 tablet (324 mg total) by mouth 2 (two) times daily with a meal. 60 tablet 2  . montelukast (SINGULAIR) 10 MG tablet Take 1 tablet (10 mg total) by mouth at bedtime. 90 tablet 1  . ondansetron (ZOFRAN ODT) 8 MG disintegrating tablet Take 1 tablet (8 mg total) by mouth every 8 (eight) hours as needed for nausea or vomiting. 10 tablet 0  . Vitamin D, Ergocalciferol, (DRISDOL) 1.25 MG (50000 UNIT) CAPS capsule Take 1 capsule (50,000 Units total) by mouth every 7 (seven) days. 12 capsule 3   No current facility-administered medications for this visit.    Family History  Problem Relation Age of Onset  . Depression Mother   . Obesity Mother   . Hypertension Father   . Sleep apnea Father   . Obesity Father   . Cancer Father        skin, basal  . Cancer Maternal Grandmother        breast  . Diabetes Maternal Grandmother   .  Cancer Maternal Grandfather        prostate  . Cancer Paternal Grandmother        breast, lymphoma  . Alzheimer's disease Paternal Grandfather   . Diabetes Paternal Grandfather   . Heart disease Neg Hx   . Stroke Neg Hx     Review of Systems  Genitourinary: Positive for vaginal bleeding.    Exam:   BP 132/78   Pulse 95   Ht 5' 1.5" (1.562 m)   Wt 254 lb (115.2 kg)   SpO2 99%   BMI 47.22 kg/m   Weight change: @WEIGHTCHANGE @ Height:   Height: 5' 1.5" (156.2 cm)  Ht Readings from Last 3 Encounters:  08/14/20 5' 1.5" (1.562 m)  07/02/20 5\' 2"  (1.575 m)  05/22/20 5\' 2"  (1.575 m)    General appearance: alert, cooperative and appears stated age Head: Normocephalic, without obvious abnormality, atraumatic Neck: no adenopathy, supple, symmetrical, trachea midline and thyroid normal to inspection and palpation Abdomen: soft, non-tender; non distended,  no masses,  no  organomegaly Extremities: extremities normal, atraumatic, no cyanosis or edema Skin: Skin color, texture, turgor normal. No rashes or lesions Lymph nodes: Cervical nodes normal. No abnormal inguinal nodes palpated Neurologic: Grossly normal   Pelvic: External genitalia:  no lesions              Urethra:  normal appearing urethra with no masses, tenderness or lesions              Bartholins and Skenes: normal                 Vagina: normal appearing vagina with normal color and discharge, no lesions              Cervix: no lesions               Bimanual Exam:  Uterus:  normal size, contour, position, consistency, mobility, non-tender and anteverted              Adnexa: no mass, fullness, tenderness                 The risks of endometrial biopsy were reviewed and a consent was obtained.  A speculum was placed in the vagina and the cervix was cleansed with betadine. A tenaculum was placed on the cervix and the pipelle was placed into the endometrial cavity. The uterus sounded to 7 cm. The endometrial biopsy was performed, taking care to get a representative sample, sampling 360 degrees of the uterine cavity. Minimal to moderate tissue was obtained. The tenaculum and speculum were removed. There were no complications.    Gae Dry chaperoned for the exam.  1. Menometrorrhagia H/O anemia - CBC - Ferritin - Surgical pathology( [place]/ [facility]) -Discussed importance of endometrial protection - medroxyPROGESTERone (PROVERA) 10 MG tablet; Take one tablet po BID until bleeding stops, then change to one tablet a day for 10 days.  Dispense: 20 tablet; Refill: 0 -discussed option of OCP's, mirena IUD and cyclic provera. No contraindications to OCP's, risks reviewed -Information on the mirena was given, will check benefits. She would like the IUD  2. Oligomenorrhea, unspecified type C/w clinical picture of PCOS - TSH - Prolactin  3. PCOS (polycystic ovarian syndrome) Clinical  picture c/w - Testosterone, Total, LC/MS/MS  4. Hirsutism Mild - Testosterone, Total, LC/MS/MS  5. BMI 45.0-49.9, adult [facility]) Discussed that weight loss could help normalize her hormones  CC: Dr Dorothea Ogle, PA Note sent

## 2020-08-16 ENCOUNTER — Telehealth: Payer: Self-pay | Admitting: *Deleted

## 2020-08-16 ENCOUNTER — Other Ambulatory Visit (HOSPITAL_COMMUNITY): Payer: Self-pay

## 2020-08-16 ENCOUNTER — Telehealth: Payer: Self-pay | Admitting: Hematology and Oncology

## 2020-08-16 ENCOUNTER — Other Ambulatory Visit: Payer: Self-pay | Admitting: Obstetrics and Gynecology

## 2020-08-16 ENCOUNTER — Encounter: Payer: Self-pay | Admitting: Obstetrics and Gynecology

## 2020-08-16 DIAGNOSIS — N912 Amenorrhea, unspecified: Secondary | ICD-10-CM

## 2020-08-16 LAB — SURGICAL PATHOLOGY

## 2020-08-16 NOTE — Telephone Encounter (Signed)
Scheduled appt per 6/10 referral. Pt aware.  

## 2020-08-16 NOTE — Telephone Encounter (Signed)
PA done via cover my meds for Provera 10 mg tablet, at the end of the PA a message came up saying "Member should be able to get the drug/product without a PA at this time."   I called pharmacy and left this message on voicemail as well. Asked them to call me if any further questions.

## 2020-08-17 LAB — TESTOSTERONE, TOTAL, LC/MS/MS: Testosterone, Total, LC-MS-MS: 33 ng/dL (ref 2–45)

## 2020-08-17 LAB — CBC
HCT: 34.4 % — ABNORMAL LOW (ref 35.0–45.0)
Hemoglobin: 10.5 g/dL — ABNORMAL LOW (ref 11.7–15.5)
MCH: 23.3 pg — ABNORMAL LOW (ref 27.0–33.0)
MCHC: 30.5 g/dL — ABNORMAL LOW (ref 32.0–36.0)
MCV: 76.4 fL — ABNORMAL LOW (ref 80.0–100.0)
MPV: 9.2 fL (ref 7.5–12.5)
Platelets: 556 10*3/uL — ABNORMAL HIGH (ref 140–400)
RBC: 4.5 10*6/uL (ref 3.80–5.10)
RDW: 18.4 % — ABNORMAL HIGH (ref 11.0–15.0)
WBC: 11.7 10*3/uL — ABNORMAL HIGH (ref 3.8–10.8)

## 2020-08-17 LAB — TSH: TSH: 1.78 mIU/L

## 2020-08-17 LAB — FERRITIN: Ferritin: 10 ng/mL — ABNORMAL LOW (ref 16–154)

## 2020-08-17 LAB — PROLACTIN: Prolactin: 9 ng/mL

## 2020-08-20 ENCOUNTER — Other Ambulatory Visit (HOSPITAL_COMMUNITY): Payer: Self-pay

## 2020-08-26 NOTE — Progress Notes (Signed)
[facility] CONSULT NOTE  Patient Care Team: Tysinger, Camelia Eng, PA-C as PCP - General (Family Medicine)  CHIEF COMPLAINTS/PURPOSE OF CONSULTATION:  Newly diagnosed anemia  HISTORY OF PRESENTING ILLNESS:  Melissa Mason 35 y.o. female is here because of recent diagnosis of anemia. Pathology of endometrium biopsy on 08/14/20 showed proliferative endometrium and no hyperplasia or malignancy. Labs on 06/08 showed Ferritin 10, Testosterone 33, WBC 11.7, RBC 4.5, HGB 10.5, HCT 34.4, and PLT 556. Labs on 07/02/20 showed Iron sat 5%. She presents to the clinic today for initial evaluation and discussion of treatment options.  She informs me that she has had heavy menstrual cycles her whole life.  In March 2022 she was in the emergency room where she was found to be anemic with hemoglobin 9.5.  Subsequently on follow-up she was prescribed oral iron over-the-counter 65 mg supplement.  She has been taking it only once a day.  In June she had a recheck of the ferritin and it was at 11.  She was therefore referred to [place] for IV iron infusions. She had seen gynecology who prescribed her Depo progesterone but she has not taken it yet.  I reviewed her records extensively and collaborated the history with the patient.  MEDICAL HISTORY:  Past Medical History:  Diagnosis Date   Anxiety    Asthma    Depression    Heartburn    Pre-diabetes    Reactive airway disease    Seasonal allergies    Vitamin D deficiency     SURGICAL HISTORY: Past Surgical History:  Procedure Laterality Date   WISDOM TOOTH EXTRACTION      SOCIAL HISTORY: Social History   Socioeconomic History   Marital status: Married    Spouse name: Not on file   Number of children: Not on file   Years of education: Not on file   Highest education level: Not on file  Occupational History   Not on file  Tobacco Use   Smoking status: Never   Smokeless tobacco: Never  Vaping Use   Vaping Use: Never used  Substance  and Sexual Activity   Alcohol use: Yes    Comment: rare   Drug use: No   Sexual activity: Yes    Birth control/protection: None  Other Topics Concern   Not on file  Social History Narrative   Lives with wife and 2 children.  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.  Exercise - chase her kids around, active on the job.  03/2020   Social Determinants of Health   Financial Resource Strain: Not on file  Food Insecurity: Not on file  Transportation Needs: Not on file  Physical Activity: Not on file  Stress: Not on file  Social Connections: Not on file  Intimate Partner Violence: Not on file    FAMILY HISTORY: Family History  Problem Relation Age of Onset   Depression Mother    Obesity Mother    Hypertension Father    Sleep apnea Father    Obesity Father    Cancer Father        skin, basal   Cancer Maternal Grandmother        breast   Diabetes Maternal Grandmother    Cancer Maternal Grandfather        prostate   Cancer Paternal Grandmother        breast, lymphoma   Alzheimer's disease Paternal Grandfather    Diabetes Paternal Grandfather    Heart disease Neg Hx  Stroke Neg Hx     ALLERGIES:  is allergic to pollen extract.  MEDICATIONS:  Current Outpatient Medications  Medication Sig Dispense Refill   albuterol (VENTOLIN HFA) 108 (90 Base) MCG/ACT inhaler INHALE 2 PUFFS INTO THE LUNGS EVERY 6 (SIX) HOURS AS NEEDED FOR WHEEZING OR SHORTNESS OF BREATH. 18 g 1   busPIRone (BUSPAR) 10 MG tablet Take 1 tablet (10 mg total) by mouth daily. 90 tablet 0   escitalopram (LEXAPRO) 20 MG tablet Take 1 tablet (20 mg total) by mouth daily. 90 tablet 0   ferrous gluconate (FERGON) 324 MG tablet Take 1 tablet (324 mg total) by mouth 2 (two) times daily with a meal. 60 tablet 2   medroxyPROGESTERone (PROVERA) 10 MG tablet Take 1 tablet by mouth 2 times  a day until bleeding stops, then change to 1 tablet once a day for 10 days. 20 tablet 0   montelukast (SINGULAIR) 10 MG tablet Take  1 tablet (10 mg total) by mouth at bedtime. 90 tablet 1   ondansetron (ZOFRAN ODT) 8 MG disintegrating tablet Take 1 tablet (8 mg total) by mouth every 8 (eight) hours as needed for nausea or vomiting. 10 tablet 0   Vitamin D, Ergocalciferol, (DRISDOL) 1.25 MG (50000 UNIT) CAPS capsule Take 1 capsule (50,000 Units total) by mouth every 7 (seven) days. 12 capsule 3   No current facility-administered medications for this visit.    REVIEW OF SYSTEMS:   Ice chip cravings, generalized fatigue  all other systems were reviewed with the patient and are negative.  PHYSICAL EXAMINATION: ECOG PERFORMANCE STATUS: 1 - Symptomatic but completely ambulatory  Vitals:   08/27/20 1359  BP: 133/73  Pulse: 96  Resp: 18  Temp: 97.6 F (36.4 C)  SpO2: 99%   Filed Weights   08/27/20 1359  Weight: 259 lb 1.6 oz (117.5 kg)       LABORATORY DATA:  I have reviewed the data as listed Lab Results  Component Value Date   WBC 11.7 (H) 08/14/2020   HGB 10.5 (L) 08/14/2020   HCT 34.4 (L) 08/14/2020   MCV 76.4 (L) 08/14/2020   PLT 556 (H) 08/14/2020   Lab Results  Component Value Date   NA 135 05/21/2020   K 3.8 05/21/2020   CL 100 05/21/2020   CO2 23 05/21/2020    RADIOGRAPHIC STUDIES: I have personally reviewed the radiological reports and agreed with the findings in the report.  ASSESSMENT AND PLAN:  Iron deficiency anemia due to chronic blood loss 08/14/2020: Hemoglobin 10.5, MCV 76.4 platelets 556, ferritin 10, iron saturation 5%, TIBC 375, vitamin D improved to 31.3 from 11.1  Most likely cause of iron deficiency anemia: Uterine bleeding Endometrial biopsy: Benign  Recommendation: IV iron I informed her that the frequency of iron infusions will depend on the severity of her uterine bleeding. Follow-up in 3 months with labs done ahead of time.   All questions were answered. The patient knows to call the clinic with any problems, questions or concerns.   Rulon Eisenmenger, MD,  MPH 08/27/2020    I, Thana Ates, am acting as scribe for Nicholas Lose, MD.  I have reviewed the above documentation for accuracy and completeness, and I agree with the above.

## 2020-08-27 ENCOUNTER — Other Ambulatory Visit: Payer: Self-pay

## 2020-08-27 ENCOUNTER — Inpatient Hospital Stay: Payer: 59 | Attending: Hematology and Oncology | Admitting: Hematology and Oncology

## 2020-08-27 ENCOUNTER — Telehealth: Payer: Self-pay | Admitting: Hematology and Oncology

## 2020-08-27 DIAGNOSIS — Z833 Family history of diabetes mellitus: Secondary | ICD-10-CM | POA: Insufficient documentation

## 2020-08-27 DIAGNOSIS — D500 Iron deficiency anemia secondary to blood loss (chronic): Secondary | ICD-10-CM | POA: Diagnosis not present

## 2020-08-27 DIAGNOSIS — Z8349 Family history of other endocrine, nutritional and metabolic diseases: Secondary | ICD-10-CM | POA: Diagnosis not present

## 2020-08-27 DIAGNOSIS — Z8249 Family history of ischemic heart disease and other diseases of the circulatory system: Secondary | ICD-10-CM | POA: Diagnosis not present

## 2020-08-27 DIAGNOSIS — Z803 Family history of malignant neoplasm of breast: Secondary | ICD-10-CM | POA: Diagnosis not present

## 2020-08-27 DIAGNOSIS — Z79899 Other long term (current) drug therapy: Secondary | ICD-10-CM | POA: Insufficient documentation

## 2020-08-27 DIAGNOSIS — Z818 Family history of other mental and behavioral disorders: Secondary | ICD-10-CM | POA: Insufficient documentation

## 2020-08-27 DIAGNOSIS — Z8042 Family history of malignant neoplasm of prostate: Secondary | ICD-10-CM | POA: Insufficient documentation

## 2020-08-27 DIAGNOSIS — E559 Vitamin D deficiency, unspecified: Secondary | ICD-10-CM | POA: Diagnosis not present

## 2020-08-27 DIAGNOSIS — N939 Abnormal uterine and vaginal bleeding, unspecified: Secondary | ICD-10-CM | POA: Insufficient documentation

## 2020-08-27 NOTE — Telephone Encounter (Signed)
Scheduled appointment per 06/21 los. Patient is aware. 

## 2020-08-27 NOTE — Assessment & Plan Note (Signed)
08/14/2020: Hemoglobin 10.5, MCV 76.4 platelets 556, ferritin 10, iron saturation 5%, TIBC 375, vitamin D improved to 31.3 from 11.1  Most likely cause of iron deficiency anemia: Uterine bleeding Endometrial biopsy: Benign  Recommendation: IV iron Follow-up in 3 months with labs done ahead of time.

## 2020-08-28 ENCOUNTER — Telehealth: Payer: Self-pay | Admitting: Hematology and Oncology

## 2020-08-28 NOTE — Telephone Encounter (Signed)
Scheduled appointment per 06/21 los. Patient is aware. 

## 2020-09-01 ENCOUNTER — Encounter: Payer: Self-pay | Admitting: Obstetrics and Gynecology

## 2020-09-01 DIAGNOSIS — N912 Amenorrhea, unspecified: Secondary | ICD-10-CM

## 2020-09-01 DIAGNOSIS — N921 Excessive and frequent menstruation with irregular cycle: Secondary | ICD-10-CM

## 2020-09-03 ENCOUNTER — Other Ambulatory Visit: Payer: Self-pay

## 2020-09-03 ENCOUNTER — Inpatient Hospital Stay: Payer: 59

## 2020-09-03 VITALS — BP 133/76 | HR 69 | Temp 98.2°F | Resp 18

## 2020-09-03 DIAGNOSIS — Z833 Family history of diabetes mellitus: Secondary | ICD-10-CM | POA: Diagnosis not present

## 2020-09-03 DIAGNOSIS — E559 Vitamin D deficiency, unspecified: Secondary | ICD-10-CM | POA: Diagnosis not present

## 2020-09-03 DIAGNOSIS — Z8349 Family history of other endocrine, nutritional and metabolic diseases: Secondary | ICD-10-CM | POA: Diagnosis not present

## 2020-09-03 DIAGNOSIS — Z79899 Other long term (current) drug therapy: Secondary | ICD-10-CM | POA: Diagnosis not present

## 2020-09-03 DIAGNOSIS — Z8249 Family history of ischemic heart disease and other diseases of the circulatory system: Secondary | ICD-10-CM | POA: Diagnosis not present

## 2020-09-03 DIAGNOSIS — N939 Abnormal uterine and vaginal bleeding, unspecified: Secondary | ICD-10-CM | POA: Diagnosis not present

## 2020-09-03 DIAGNOSIS — Z818 Family history of other mental and behavioral disorders: Secondary | ICD-10-CM | POA: Diagnosis not present

## 2020-09-03 DIAGNOSIS — D500 Iron deficiency anemia secondary to blood loss (chronic): Secondary | ICD-10-CM | POA: Diagnosis not present

## 2020-09-03 DIAGNOSIS — Z8042 Family history of malignant neoplasm of prostate: Secondary | ICD-10-CM | POA: Diagnosis not present

## 2020-09-03 DIAGNOSIS — N921 Excessive and frequent menstruation with irregular cycle: Secondary | ICD-10-CM

## 2020-09-03 MED ORDER — SODIUM CHLORIDE 0.9 % IV SOLN
300.0000 mg | Freq: Once | INTRAVENOUS | Status: AC
  Administered 2020-09-03: 300 mg via INTRAVENOUS
  Filled 2020-09-03: qty 300

## 2020-09-03 MED ORDER — ACETAMINOPHEN 325 MG PO TABS
ORAL_TABLET | ORAL | Status: AC
  Filled 2020-09-03: qty 2

## 2020-09-03 MED ORDER — SODIUM CHLORIDE 0.9 % IV SOLN
Freq: Once | INTRAVENOUS | Status: AC
  Filled 2020-09-03: qty 250

## 2020-09-03 MED ORDER — ACETAMINOPHEN 325 MG PO TABS
650.0000 mg | ORAL_TABLET | Freq: Once | ORAL | Status: AC
Start: 2020-09-03 — End: 2020-09-03
  Administered 2020-09-03: 650 mg via ORAL

## 2020-09-03 NOTE — Patient Instructions (Signed)

## 2020-09-05 ENCOUNTER — Telehealth: Payer: Self-pay

## 2020-09-05 NOTE — Telephone Encounter (Signed)
Message from from desk:  "Can someone check with Dr. Talbert Nan on this patient as to whether or not she has to be on her cycle for insertion of her Mirena since her cycles are irregular."

## 2020-09-05 NOTE — Telephone Encounter (Signed)
No, she isn't at risk of pregnancy. The IUD can be placed at any time in her cycle.

## 2020-09-10 ENCOUNTER — Encounter: Payer: Self-pay | Admitting: Hematology and Oncology

## 2020-09-10 ENCOUNTER — Telehealth: Payer: Self-pay | Admitting: Hematology and Oncology

## 2020-09-10 NOTE — Telephone Encounter (Signed)
Staff message returned to [place] at front desk informing her.

## 2020-09-10 NOTE — Telephone Encounter (Signed)
R/s appts per 7/5 sch msg. Pt aware.

## 2020-09-12 ENCOUNTER — Inpatient Hospital Stay: Payer: 59

## 2020-09-18 ENCOUNTER — Ambulatory Visit: Payer: 59

## 2020-09-19 ENCOUNTER — Encounter: Payer: Self-pay | Admitting: Obstetrics and Gynecology

## 2020-09-24 NOTE — Telephone Encounter (Signed)
Patient needs OV with provided to discuss IUD questions.

## 2020-09-26 ENCOUNTER — Inpatient Hospital Stay: Payer: 59 | Attending: Hematology and Oncology

## 2020-09-26 ENCOUNTER — Other Ambulatory Visit: Payer: Self-pay

## 2020-09-26 ENCOUNTER — Other Ambulatory Visit: Payer: Self-pay | Admitting: Hematology and Oncology

## 2020-09-26 VITALS — BP 125/69 | HR 84 | Temp 97.6°F | Resp 17

## 2020-09-26 DIAGNOSIS — N920 Excessive and frequent menstruation with regular cycle: Secondary | ICD-10-CM | POA: Diagnosis not present

## 2020-09-26 DIAGNOSIS — D500 Iron deficiency anemia secondary to blood loss (chronic): Secondary | ICD-10-CM | POA: Insufficient documentation

## 2020-09-26 DIAGNOSIS — N921 Excessive and frequent menstruation with irregular cycle: Secondary | ICD-10-CM

## 2020-09-26 MED ORDER — SODIUM CHLORIDE 0.9 % IV SOLN
Freq: Once | INTRAVENOUS | Status: AC
  Filled 2020-09-26: qty 250

## 2020-09-26 MED ORDER — SODIUM CHLORIDE 0.9 % IV SOLN
300.0000 mg | Freq: Once | INTRAVENOUS | Status: AC
  Administered 2020-09-26: 300 mg via INTRAVENOUS
  Filled 2020-09-26: qty 300

## 2020-09-26 MED ORDER — ACETAMINOPHEN 325 MG PO TABS
650.0000 mg | ORAL_TABLET | Freq: Once | ORAL | Status: AC
Start: 2020-09-26 — End: 2020-09-26
  Administered 2020-09-26: 650 mg via ORAL

## 2020-09-26 MED ORDER — ACETAMINOPHEN 325 MG PO TABS
ORAL_TABLET | ORAL | Status: AC
  Filled 2020-09-26: qty 2

## 2020-09-26 NOTE — Patient Instructions (Signed)

## 2020-10-03 ENCOUNTER — Inpatient Hospital Stay: Payer: 59

## 2020-10-04 ENCOUNTER — Inpatient Hospital Stay: Payer: 59

## 2020-10-04 ENCOUNTER — Other Ambulatory Visit: Payer: Self-pay

## 2020-10-04 VITALS — BP 134/85 | HR 84 | Temp 99.3°F | Resp 17

## 2020-10-04 DIAGNOSIS — D500 Iron deficiency anemia secondary to blood loss (chronic): Secondary | ICD-10-CM

## 2020-10-04 DIAGNOSIS — N920 Excessive and frequent menstruation with regular cycle: Secondary | ICD-10-CM | POA: Diagnosis not present

## 2020-10-04 DIAGNOSIS — N921 Excessive and frequent menstruation with irregular cycle: Secondary | ICD-10-CM

## 2020-10-04 MED ORDER — SODIUM CHLORIDE 0.9 % IV SOLN
Freq: Once | INTRAVENOUS | Status: AC
  Filled 2020-10-04: qty 250

## 2020-10-04 MED ORDER — SODIUM CHLORIDE 0.9 % IV SOLN
300.0000 mg | Freq: Once | INTRAVENOUS | Status: AC
  Administered 2020-10-04: 300 mg via INTRAVENOUS
  Filled 2020-10-04: qty 300

## 2020-10-04 MED ORDER — ACETAMINOPHEN 325 MG PO TABS
650.0000 mg | ORAL_TABLET | Freq: Once | ORAL | Status: AC
  Administered 2020-10-04: 650 mg via ORAL

## 2020-10-04 MED ORDER — ACETAMINOPHEN 325 MG PO TABS
ORAL_TABLET | ORAL | Status: AC
  Filled 2020-10-04: qty 2

## 2020-10-04 NOTE — Patient Instructions (Signed)

## 2020-10-08 ENCOUNTER — Ambulatory Visit: Payer: 59 | Admitting: Obstetrics and Gynecology

## 2020-10-08 ENCOUNTER — Other Ambulatory Visit: Payer: Self-pay

## 2020-10-08 ENCOUNTER — Encounter: Payer: Self-pay | Admitting: Obstetrics and Gynecology

## 2020-10-08 ENCOUNTER — Other Ambulatory Visit (HOSPITAL_COMMUNITY): Payer: Self-pay

## 2020-10-08 VITALS — BP 142/74 | HR 96 | Ht 61.5 in | Wt 261.0 lb

## 2020-10-08 DIAGNOSIS — Z3009 Encounter for other general counseling and advice on contraception: Secondary | ICD-10-CM

## 2020-10-08 DIAGNOSIS — N915 Oligomenorrhea, unspecified: Secondary | ICD-10-CM | POA: Diagnosis not present

## 2020-10-08 MED ORDER — MEDROXYPROGESTERONE ACETATE 5 MG PO TABS
ORAL_TABLET | ORAL | 3 refills | Status: DC
  Filled 2020-10-08 – 2020-10-18 (×2): qty 15, 84d supply, fill #0

## 2020-10-08 NOTE — Progress Notes (Signed)
GYNECOLOGY  VISIT   HPI: 35 y.o.   Married White or Caucasian Not Hispanic or Latino  female   [id number] with Patient's last menstrual period was 08/14/2020.   here to discuss next steps. She has one more iron infusion. She is also wanting to see if she can carry a child. If not she would like to implant her egg in to her wife.    The patient was seen last month with menometrorrhagia and anemia.  An endometrial biopsy returned with proliferative endometrium. Hgb was 10.5, ferritin was 10, prolactin was 9, TSH was 1.78. She has had 3 iron transfusions since that visit.  She was given a script for provera and options of cycle control were discussed. She didn't take the provera because her cycle stopped.   She is here with her wife today to discuss the possibility of getting pregnant and the possibility of donating an egg to her wife. Her wife is 65 and has had 2 children with donor sperm.    GYNECOLOGIC HISTORY: Patient's last menstrual period was 08/14/2020. Contraception:none  Menopausal hormone therapy: none         OB History     Gravida  0   Para  0   Term  0   Preterm  0   AB  0   Living  0      SAB  0   IAB  0   Ectopic  0   Multiple  0   Live Births  0              Patient Active Problem List   Diagnosis Date Noted   Iron deficiency anemia due to chronic blood loss 08/27/2020   Menorrhagia with irregular cycle 07/02/2020   Acute otitis media 07/02/2020   Seasonal allergic rhinitis due to pollen 06/25/2020   Diarrhea 05/22/2020   Hoarse voice quality 05/22/2020   Nausea and vomiting 05/22/2020   Leukocytosis 05/22/2020   Vaccine counseling 04/03/2020   Family history of breast cancer 04/03/2020   BMI 40.0-44.9, adult ([place]) 04/03/2020   Impaired fasting blood sugar 04/03/2020   Encounter for health maintenance examination in adult 04/03/2020   Mild intermittent asthma 04/03/2020   Dysuria 09/25/2019   Generalized anxiety disorder 09/25/2019    Vitamin D deficiency 09/25/2019    Past Medical History:  Diagnosis Date   Anxiety    Asthma    Depression    Heartburn    Pre-diabetes    Reactive airway disease    Seasonal allergies    Vitamin D deficiency     Past Surgical History:  Procedure Laterality Date   WISDOM TOOTH EXTRACTION      Current Outpatient Medications  Medication Sig Dispense Refill   albuterol (VENTOLIN HFA) 108 (90 Base) MCG/ACT inhaler INHALE 2 PUFFS INTO THE LUNGS EVERY 6 (SIX) HOURS AS NEEDED FOR WHEEZING OR SHORTNESS OF BREATH. 18 g 1   busPIRone (BUSPAR) 10 MG tablet Take 1 tablet (10 mg total) by mouth daily. 90 tablet 0   escitalopram (LEXAPRO) 20 MG tablet Take 1 tablet (20 mg total) by mouth daily. 90 tablet 0   montelukast (SINGULAIR) 10 MG tablet Take 1 tablet (10 mg total) by mouth at bedtime. 90 tablet 1   Vitamin D, Ergocalciferol, (DRISDOL) 1.25 MG (50000 UNIT) CAPS capsule Take 1 capsule (50,000 Units total) by mouth every 7 (seven) days. 12 capsule 3   No current facility-administered medications for this visit.     ALLERGIES: Pollen  extract  Family History  Problem Relation Age of Onset   Depression Mother    Obesity Mother    Hypertension Father    Sleep apnea Father    Obesity Father    Cancer Father        skin, basal   Cancer Maternal Grandmother        breast   Diabetes Maternal Grandmother    Cancer Maternal Grandfather        prostate   Cancer Paternal Grandmother        breast, lymphoma   Alzheimer's disease Paternal Grandfather    Diabetes Paternal Grandfather    Heart disease Neg Hx    Stroke Neg Hx     Social History   Socioeconomic History   Marital status: Married    Spouse name: Not on file   Number of children: Not on file   Years of education: Not on file   Highest education level: Not on file  Occupational History   Not on file  Tobacco Use   Smoking status: Never   Smokeless tobacco: Never  Vaping Use   Vaping Use: Never used  Substance  and Sexual Activity   Alcohol use: Yes    Comment: rare   Drug use: No   Sexual activity: Yes    Birth control/protection: None  Other Topics Concern   Not on file  Social History Narrative   Lives with wife and 2 children.  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  Exercise - chase her kids around, active on the job.  03/2020   Social Determinants of Health   Financial Resource Strain: Not on file  Food Insecurity: Not on file  Transportation Needs: Not on file  Physical Activity: Not on file  Stress: Not on file  Social Connections: Not on file  Intimate Partner Violence: Not on file    Review of Systems  All other systems reviewed and are negative.  PHYSICAL EXAMINATION:    BP (!) 142/74   Pulse 96   Ht 5' 1.5" (1.562 m)   Wt 261 lb (118.4 kg)   LMP 08/14/2020   SpO2 100%   BMI 48.52 kg/m     General appearance: alert, cooperative and appears stated age  82. Oligomenorrhea, unspecified type For now will treat with cyclic provera.  - medroxyPROGESTERone (PROVERA) 5 MG tablet; Take one tablet a day for 5 days every month if no spontaneous cycle  Dispense: 15 tablet; Refill: 3 -Considering mirena IUD placement  2. Family planning counseling She is considering pregnancy or donating an egg to her wife.  We discussed risk of DM and HTN with AMA. Discussed small risk of chromosomal abnormalities.  Discussed that she would likely need ovulation induction Will refer to Dr Kerin Perna to discuss options of ovulation induction and artifical insemination and donor egg.   Over 20 minutes spent in total patient care.

## 2020-10-10 ENCOUNTER — Other Ambulatory Visit: Payer: Self-pay

## 2020-10-10 ENCOUNTER — Ambulatory Visit: Payer: 59

## 2020-10-10 ENCOUNTER — Inpatient Hospital Stay: Payer: 59 | Attending: Hematology and Oncology

## 2020-10-10 VITALS — BP 128/82 | HR 72 | Temp 97.7°F | Resp 18

## 2020-10-10 DIAGNOSIS — D500 Iron deficiency anemia secondary to blood loss (chronic): Secondary | ICD-10-CM | POA: Diagnosis not present

## 2020-10-10 DIAGNOSIS — N921 Excessive and frequent menstruation with irregular cycle: Secondary | ICD-10-CM

## 2020-10-10 DIAGNOSIS — Z79899 Other long term (current) drug therapy: Secondary | ICD-10-CM | POA: Insufficient documentation

## 2020-10-10 MED ORDER — SODIUM CHLORIDE 0.9 % IV SOLN
INTRAVENOUS | Status: DC
  Filled 2020-10-10: qty 250

## 2020-10-10 MED ORDER — SODIUM CHLORIDE 0.9 % IV SOLN
300.0000 mg | Freq: Once | INTRAVENOUS | Status: AC
  Administered 2020-10-10: 300 mg via INTRAVENOUS
  Filled 2020-10-10: qty 300

## 2020-10-10 NOTE — Patient Instructions (Signed)

## 2020-10-13 ENCOUNTER — Encounter: Payer: Self-pay | Admitting: Obstetrics and Gynecology

## 2020-10-14 ENCOUNTER — Telehealth: Payer: Self-pay

## 2020-10-14 NOTE — Telephone Encounter (Signed)
Message from Dr. Talbert Nan "Please call and check on the patient. Please let her know that I have been in surgery all day and am going back to surgery now. Let her know that all the provera is doing is emptying out the lining of her uterus. The lining has gotten thickened because she is not ovulating regularly.  If she didn't take the provera she still would have bleed, it might have been continued daily bleeding and could have been just as heavy. The withdrawal bleeding from the provera should end (typically within a week). The provera didn't create the thickened lining. I would hope that by today her bleeding has slowed down some. If her bleeding is still heavy, Ibuprofen can help decrease the flow but if she is continually saturating a pad in less than an hour, she needs to go to the ER. The idea is that if she takes the provera monthly that her lining won't get overly thickened and she will just have a normal cycle. Please confirm that she took the full 5 days of the provera. Also, let her know that if she has an emergency she should page the on call Doctor. I see that she sent the mychart message yesterday. No one is looking at these messages after hours. "  I called and spoke with patient and read her Dr. Gentry Fitz reply.  Also, sent to her My Chart.  Bleeding started Friday night. She took the 5th Provera yesterday.  She said the bleeding got heavy yesterday and continues. She just left work to go home to change clothes because she bled through onto her clothes.  Advised per Dr. Talbert Nan note she should go to ER if continuously saturating a pad every hour or less.

## 2020-10-14 NOTE — Telephone Encounter (Signed)
Please call and check on the patient. Please let her know that I have been in surgery all day and am going back to surgery now.  Let her know that all the provera is doing is emptying out the lining of her uterus. The lining has gotten thickened because she is not ovulating regularly.  If she didn't take the provera she still would have bleed, it might have been continued daily bleeding and could have been just as heavy.  The withdrawal bleeding from the provera should end (typically within a week). The provera didn't create the thickened lining. I would hope that by today her bleeding has slowed down some.  If her bleeding is still heavy, Ibuprofen can help decrease the flow but if she is continually saturating a pad in less than an hour, she needs to go to the ER.  The idea is that if she takes the provera monthly that her lining won't get overly thickened and she will just have a normal cycle. Please confirm that she took the full 5 days of the provera.  Also, let her know that if she has an emergency she should page the on call Doctor. I see that she sent the mychart message yesterday. No one is looking at these messages after hours.

## 2020-10-16 ENCOUNTER — Other Ambulatory Visit (HOSPITAL_BASED_OUTPATIENT_CLINIC_OR_DEPARTMENT_OTHER): Payer: Self-pay

## 2020-10-16 ENCOUNTER — Other Ambulatory Visit (HOSPITAL_COMMUNITY): Payer: Self-pay

## 2020-10-18 ENCOUNTER — Other Ambulatory Visit (HOSPITAL_COMMUNITY): Payer: Self-pay

## 2020-10-31 ENCOUNTER — Other Ambulatory Visit: Payer: Self-pay | Admitting: Internal Medicine

## 2020-10-31 ENCOUNTER — Other Ambulatory Visit (HOSPITAL_COMMUNITY): Payer: Self-pay

## 2020-10-31 MED ORDER — ESCITALOPRAM OXALATE 20 MG PO TABS
20.0000 mg | ORAL_TABLET | Freq: Every day | ORAL | 0 refills | Status: DC
  Filled 2020-10-31: qty 90, 90d supply, fill #0

## 2020-11-01 ENCOUNTER — Other Ambulatory Visit: Payer: Self-pay | Admitting: Medical

## 2020-11-01 ENCOUNTER — Other Ambulatory Visit (HOSPITAL_COMMUNITY): Payer: Self-pay

## 2020-11-01 MED ORDER — BUSPIRONE HCL 10 MG PO TABS
10.0000 mg | ORAL_TABLET | Freq: Every day | ORAL | 0 refills | Status: DC
  Filled 2020-11-01: qty 90, 90d supply, fill #0

## 2020-11-04 ENCOUNTER — Telehealth: Payer: Self-pay | Admitting: Medical

## 2020-11-04 DIAGNOSIS — Z23 Encounter for immunization: Secondary | ICD-10-CM

## 2020-11-04 DIAGNOSIS — Z111 Encounter for screening for respiratory tuberculosis: Secondary | ICD-10-CM

## 2020-11-04 NOTE — Telephone Encounter (Signed)
Received a form from pt via email. This is a cpe form and pt had one 04/03/2020. Sending back to [place] to be completed and signed. Return to pt via email.

## 2020-11-04 NOTE — Telephone Encounter (Signed)
Please look over form. Looks like she is overdue to TDAP as last tdap was 05-12-10 and quantiferon was never done

## 2020-11-05 NOTE — Telephone Encounter (Signed)
I have form on my desk to be finished. Once she comes in for tdap and blood work she can have form back

## 2020-11-06 ENCOUNTER — Other Ambulatory Visit: Payer: 59

## 2020-11-07 ENCOUNTER — Other Ambulatory Visit (INDEPENDENT_AMBULATORY_CARE_PROVIDER_SITE_OTHER): Payer: 59

## 2020-11-07 ENCOUNTER — Other Ambulatory Visit: Payer: Self-pay

## 2020-11-07 ENCOUNTER — Other Ambulatory Visit (HOSPITAL_COMMUNITY): Payer: Self-pay

## 2020-11-07 ENCOUNTER — Encounter: Payer: Self-pay | Admitting: Family Medicine

## 2020-11-07 ENCOUNTER — Ambulatory Visit: Payer: 59 | Admitting: Family Medicine

## 2020-11-07 VITALS — BP 120/78 | HR 76 | Temp 99.4°F | Ht 61.5 in | Wt 261.4 lb

## 2020-11-07 DIAGNOSIS — K148 Other diseases of tongue: Secondary | ICD-10-CM | POA: Diagnosis not present

## 2020-11-07 DIAGNOSIS — Z111 Encounter for screening for respiratory tuberculosis: Secondary | ICD-10-CM

## 2020-11-07 DIAGNOSIS — Z23 Encounter for immunization: Secondary | ICD-10-CM | POA: Diagnosis not present

## 2020-11-07 MED ORDER — TRIAMCINOLONE ACETONIDE 0.1 % MT PSTE
1.0000 "application " | PASTE | Freq: Two times a day (BID) | OROMUCOSAL | 1 refills | Status: DC
  Filled 2020-11-07: qty 5, 3d supply, fill #0

## 2020-11-07 NOTE — Progress Notes (Signed)
Chief Complaint  Patient presents with   Tongue Sore    Sore spot on right side of her tongue. Saw dentist yesterday and he didn't say anything about it. Right side of her neck is sore as well.    Per her message: "I have a spot on my tongue that looks and feels worst then a normal small Soren would say it is probably a 1/2 inch in diameter and very painful.  Went to dentist yesterday because tooth on that side was hurting too.  I'm just worried it's an infection but dentist didn't believe it to be."   Her tooth on R lower jaw started hurting 4 nights ago.  Monday she noticed a spot on the right side of the top of her tongue, and has gradually gotten more painful this week. Now the tongue pain is worse than the tooth pain (tooth only hurts with biting). She saw the dentist yesterday.  At dentist had x-rays, was referred to endodontist, scheduled for next week.  She is sore at the R lateral neck, enlarged lymph nodes and also at R lower jaw.  Nonsmoker. Doesn't believe she had HPV vaccines   PMH, PSH, SH reviewed  Outpatient Encounter Medications as of 11/07/2020  Medication Sig Note   busPIRone (BUSPAR) 10 MG tablet Take 1 tablet (10 mg total) by mouth daily.    cetirizine (ZYRTEC) 10 MG tablet Take 10 mg by mouth daily.    escitalopram (LEXAPRO) 20 MG tablet Take 1 tablet (20 mg total) by mouth daily.    ibuprofen (ADVIL) 200 MG tablet Take 600 mg by mouth every 6 (six) hours as needed. 11/07/2020: Last dose 8am   montelukast (SINGULAIR) 10 MG tablet Take 1 tablet (10 mg total) by mouth at bedtime.    albuterol (VENTOLIN HFA) 108 (90 Base) MCG/ACT inhaler INHALE 2 PUFFS INTO THE LUNGS EVERY 6 (SIX) HOURS AS NEEDED FOR WHEEZING OR SHORTNESS OF BREATH. (Patient not taking: Reported on 11/07/2020)    medroxyPROGESTERone (PROVERA) 5 MG tablet Take one tablet a day for 5 days every month if no spontaneous cycle (Patient not taking: Reported on 11/07/2020) 11/07/2020: Will start tonight   Vitamin D,  Ergocalciferol, (DRISDOL) 1.25 MG (50000 UNIT) CAPS capsule Take 1 capsule (50,000 Units total) by mouth every 7 (seven) days. (Patient not taking: Reported on 11/07/2020)    No facility-administered encounter medications on file as of 11/07/2020.   Allergies  Allergen Reactions   Pollen Extract Other (See Comments)    Sneezing sneezing     ROS: Allergies aren't flaring, not needing albuterol. No known fever, chills, URI symptoms. 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  No known fevers.  Pleasant female, in no distress HEENT: conjunctiva and sclera are clear, EOMI. OP: 1cm lesion of R side of the top of her tongue. It is somewhat raised and slightly firm, but not particularly tender to touch. No erythema, no ulceration.   Rest of mucosa appears normal. Mild inflammation noted along lower R gums. Neck: tender along R anterior cervical chain, mild lymphadenopathy noted.  She also had an enlarged  LN, tender, at the R lower jaw line   ASSESSMENT/PLAN:  Tongue lesion - unclear etiology. Ddx reviewed. Unclear how related to tooth pain or LAD, now c/w infection. Supportive measures, including TAC. f/u with specialist as planned - Plan: triamcinolone (KENALOG) 0.1 % paste  Discussed supportive measures regarding pain control, including some OTC meds, tylenol, salt water gargles, avoiding acidic/spicy foods.  LAD and LG fever suggest poss infection--pt denies fever, normal temp at office prior to coming.  Tongue lesion doesn't look infected--likely more related to dental issue.  Has appt with specialist next week.   Lesion on tongue, unclear etiology.  Not c/w thrush, leukoplakia, lichen planus  or aphthous ulcers. It is tender. I don't think there is an infection and the reason behind the LAD--that is more likely related to the tooth issue. F/u with endodontist as planned for next week. Use tylenol as needed for pain. You can try salt water gargles. Monitor for fever (you had low grade in the office).   Avoid spicy foods, acidic foods (citrus, tomatoes), carbonation.

## 2020-11-07 NOTE — Patient Instructions (Addendum)
  Lesion on tongue, unclear etiology. It doesn't appear to be yeast/thrush,  I don't think there is an infection within the tongue, and the reason behind the swollen glands-that is more likely related to the tooth issue. See the endodontist as planned for next week. Use tylenol as needed for pain. You can try salt water gargles. Monitor for fever (you had low grade in the office).  We will try the kenalog paste to see if this helps.  Avoid spicy foods, acidic foods (citrus, tomatoes), carbonation.

## 2020-11-08 ENCOUNTER — Other Ambulatory Visit: Payer: Self-pay | Admitting: Family Medicine

## 2020-11-08 ENCOUNTER — Encounter: Payer: Self-pay | Admitting: Family Medicine

## 2020-11-08 MED ORDER — AMOXICILLIN 875 MG PO TABS
875.0000 mg | ORAL_TABLET | Freq: Two times a day (BID) | ORAL | 0 refills | Status: AC
  Filled 2020-11-08 – 2020-11-12 (×2): qty 20, 10d supply, fill #0

## 2020-11-08 NOTE — Progress Notes (Signed)
   Subjective:    Patient ID: Melissa Mason, female    DOB: Mar 06, 1986, 35 y.o.   MRN: [id number]  HPI    Review of Systems     Objective:   Physical Exam        Assessment & Plan:

## 2020-11-09 ENCOUNTER — Other Ambulatory Visit (HOSPITAL_COMMUNITY): Payer: Self-pay

## 2020-11-09 MED ORDER — VALACYCLOVIR HCL 1 G PO TABS
1000.0000 mg | ORAL_TABLET | Freq: Three times a day (TID) | ORAL | 0 refills | Status: AC
  Filled 2020-11-09: qty 21, 7d supply, fill #0

## 2020-11-10 ENCOUNTER — Telehealth: Payer: 59 | Admitting: Family

## 2020-11-10 DIAGNOSIS — B029 Zoster without complications: Secondary | ICD-10-CM

## 2020-11-10 MED ORDER — PREDNISONE 10 MG (21) PO TBPK: ORAL_TABLET | ORAL | 0 refills | Status: DC

## 2020-11-10 MED ORDER — GABAPENTIN 300 MG PO CAPS: 300.0000 mg | ORAL_CAPSULE | Freq: Three times a day (TID) | ORAL | 0 refills | Status: DC

## 2020-11-10 NOTE — Progress Notes (Signed)
Virtual Visit Consent   Melissa Mason, you are scheduled for a virtual visit with a [facility] provider today.     Just as with appointments in the office, your consent must be obtained to participate.  Your consent will be active for this visit and any virtual visit you may have with one of our providers in the next 365 days.     If you have a MyChart account, a copy of this consent can be sent to you electronically.  All virtual visits are billed to your insurance company just like a traditional visit in the office.    As this is a virtual visit, video technology does not allow for your provider to perform a traditional examination.  This may limit your provider's ability to fully assess your condition.  If your provider identifies any concerns that need to be evaluated in person or the need to arrange testing (such as labs, EKG, etc.), we will make arrangements to do so.     Although advances in technology are sophisticated, we cannot ensure that it will always work on either your end or our end.  If the connection with a video visit is poor, the visit may have to be switched to a telephone visit.  With either a video or telephone visit, we are not always able to ensure that we have a secure connection.     I need to obtain your verbal consent now.   Are you willing to proceed with your visit today?    Sandhya NASTACIA KROUSE has provided verbal consent on 11/10/2020 for a virtual visit (video or telephone).   Evelina Dun, FNP   Date: 11/10/2020 3:19 PM   Virtual Visit via Video Note   I, Evelina Dun, connected with  Melissa Mason  ([id number], 10-Jan-1986) on 11/10/20 at  3:15 PM EDT by a video-enabled telemedicine application and verified that I am speaking with the correct person using two identifiers.  Location: Patient: Virtual Visit Location Patient: Other: car Provider: Virtual Visit Location Provider: Home   I discussed the limitations of evaluation and management by telemedicine and  the availability of in person appointments. The patient expressed understanding and agreed to proceed.    History of Present Illness: Melissa Mason is a 35 y.o. who identifies as a female who was assigned female at birth, and is being seen today for shingles. She reports 11/03/20 with right tooth pain then a tongue lesion on 11/04/20 and through out the week her lesions spread to her right cheek, and ear. States it starts out like three small blisters and crusts over. She was started on Valtrex 11/09/20. She states she is having sharp nerve pain of 8 out 10.   HPI: HPI  Problems:  Patient Active Problem List   Diagnosis Date Noted   Iron deficiency anemia due to chronic blood loss 08/27/2020   Menorrhagia with irregular cycle 07/02/2020   Acute otitis media 07/02/2020   Seasonal allergic rhinitis due to pollen 06/25/2020   Diarrhea 05/22/2020   Hoarse voice quality 05/22/2020   Nausea and vomiting 05/22/2020   Leukocytosis 05/22/2020   Vaccine counseling 04/03/2020   Family history of breast cancer 04/03/2020   BMI 40.0-44.9, adult ([employer]) 04/03/2020   Impaired fasting blood sugar 04/03/2020   Encounter for health maintenance examination in adult 04/03/2020   Mild intermittent asthma 04/03/2020   Dysuria 09/25/2019   Generalized anxiety disorder 09/25/2019   Vitamin D deficiency 09/25/2019    Allergies:  Allergies  Allergen Reactions   Pollen Extract Other (See Comments)    Sneezing sneezing    Medications:  Current Outpatient Medications:    gabapentin (NEURONTIN) 300 MG capsule, Take 1 capsule (300 mg total) by mouth 3 (three) times daily., Disp: 30 capsule, Rfl: 0   predniSONE (STERAPRED UNI-PAK 21 TAB) 10 MG (21) TBPK tablet, Use as directed, Disp: 21 tablet, Rfl: 0   albuterol (VENTOLIN HFA) 108 (90 Base) MCG/ACT inhaler, INHALE 2 PUFFS INTO THE LUNGS EVERY 6 (SIX) HOURS AS NEEDED FOR WHEEZING OR SHORTNESS OF BREATH. (Patient not taking: Reported on 11/07/2020), Disp: 18 g,  Rfl: 1   amoxicillin (AMOXIL) 875 MG tablet, Take 1 tablet (875 mg total) by mouth 2 (two) times daily for 10 days., Disp: 20 tablet, Rfl: 0   busPIRone (BUSPAR) 10 MG tablet, Take 1 tablet (10 mg total) by mouth daily., Disp: 90 tablet, Rfl: 0   cetirizine (ZYRTEC) 10 MG tablet, Take 10 mg by mouth daily., Disp: , Rfl:    escitalopram (LEXAPRO) 20 MG tablet, Take 1 tablet (20 mg total) by mouth daily., Disp: 90 tablet, Rfl: 0   ibuprofen (ADVIL) 200 MG tablet, Take 600 mg by mouth every 6 (six) hours as needed., Disp: , Rfl:    medroxyPROGESTERone (PROVERA) 5 MG tablet, Take one tablet a day for 5 days every month if no spontaneous cycle (Patient not taking: Reported on 11/07/2020), Disp: 15 tablet, Rfl: 3   montelukast (SINGULAIR) 10 MG tablet, Take 1 tablet (10 mg total) by mouth at bedtime., Disp: 90 tablet, Rfl: 1   triamcinolone (KENALOG) 0.1 % paste, Use 1 application in the mouth or throat 2 (two) times daily., Disp: 5 g, Rfl: 1   valACYclovir (VALTREX) 1000 MG tablet, Take 1 tablet (1,000 mg total) by mouth 3 (three) times daily for 7 days., Disp: 21 tablet, Rfl: 0  Observations/Objective: Patient is well-developed, well-nourished in no acute distress.  Head is normocephalic, atraumatic.  No labored breathing.  Speech is clear and coherent with logical content.  Patient is alert and oriented at baseline.  Skin lesion on right cheek, ear lob, and tongue. Cluster on blisters on cheek.   Assessment and Plan: 1. Herpes zoster without complication - gabapentin (NEURONTIN) 300 MG capsule; Take 1 capsule (300 mg total) by mouth 3 (three) times daily.  Dispense: 30 capsule; Refill: 0 - predniSONE (STERAPRED UNI-PAK 21 TAB) 10 MG (21) TBPK tablet; Use as directed  Dispense: 21 tablet; Refill: 0 Continue Valtrex  Can start Gabapentin TID, sedation precautions discussed Motrin as needed   Follow up if symptoms worsen or do not improve   Follow Up Instructions: I discussed the assessment and  treatment plan with the patient. The patient was provided an opportunity to ask questions and all were answered. The patient agreed with the plan and demonstrated an understanding of the instructions.  A copy of instructions were sent to the patient via MyChart.  The patient was advised to call back or seek an in-person evaluation if the symptoms worsen or if the condition fails to improve as anticipated.  Time:  I spent 13 minutes with the patient via telehealth technology discussing the above problems/concerns.    Evelina Dun, FNP

## 2020-11-11 ENCOUNTER — Telehealth: Payer: Self-pay | Admitting: Family Medicine

## 2020-11-11 NOTE — Telephone Encounter (Signed)
Patient called on call provider due to worsening sore in her mouth, new swelling and rash of her cheek. Antibiotic prescribed and recommend follow up in office next week or go to urgent care/ED if worsening over the weekend.

## 2020-11-12 ENCOUNTER — Telehealth: Payer: Self-pay | Admitting: Family Medicine

## 2020-11-12 ENCOUNTER — Other Ambulatory Visit (HOSPITAL_COMMUNITY): Payer: Self-pay

## 2020-11-12 LAB — QUANTIFERON-TB GOLD PLUS
QuantiFERON Mitogen Value: >10 IU/mL
QuantiFERON Nil Value: 0.3 IU/mL
QuantiFERON TB1 Ag Value: 0.34 IU/mL
QuantiFERON TB2 Ag Value: 0.32 IU/mL
QuantiFERON-TB Gold Plus: NEGATIVE

## 2020-11-12 NOTE — Telephone Encounter (Signed)
PT called she has been dx with Shingles.  Urgent care pcp gave her Valtrex, Prednisone, Gabapentin and antbx as sores are in her mouth.   She is concerned she will run out of Gabapentin before nerve pain is better.  She has been taking meds 2 days and has enough for 10 days.  I asked her to call us closer to 10 days and let us know how she is doing.  Pt wanted to know if there was anything else you wanted her to do [phone]

## 2020-11-13 NOTE — Telephone Encounter (Signed)
Left pt a vm to schedule appt

## 2020-11-14 ENCOUNTER — Other Ambulatory Visit: Payer: Self-pay

## 2020-11-14 ENCOUNTER — Ambulatory Visit: Payer: 59 | Admitting: Medical

## 2020-11-14 ENCOUNTER — Other Ambulatory Visit (HOSPITAL_COMMUNITY): Payer: Self-pay

## 2020-11-14 ENCOUNTER — Encounter: Payer: Self-pay | Admitting: Medical

## 2020-11-14 VITALS — BP 132/78 | HR 90 | Temp 98.5°F | Wt 266.0 lb

## 2020-11-14 DIAGNOSIS — B028 Zoster with other complications: Secondary | ICD-10-CM | POA: Diagnosis not present

## 2020-11-14 MED ORDER — GABAPENTIN 300 MG PO CAPS
300.0000 mg | ORAL_CAPSULE | Freq: Two times a day (BID) | ORAL | 0 refills | Status: DC
  Filled 2020-11-14 (×2): qty 60, 30d supply, fill #0

## 2020-11-14 NOTE — Progress Notes (Signed)
Subjective:  Melissa Mason is a 35 y.o. female who presents for Chief Complaint  Patient presents with   other    F/u from urgent care shingles diagnosis at urgent care went Sunday virtual apt. On tongue, gums, ear and face. Pain on rt. Side of face not as bad now with the gabapentin.      Here for follow-up on shingles.  Her initial symptoms started almost 27.  She was seen here on September 1 with Melissa Mason for a tongue lesion.  However last week she started getting redness and then subsequent blisters and rash on her right cheek, right corner of mouth and right ear.  She ended up going to urgent care who diagnosed her with shingles.  She was put on prednisone, Valtrex and gabapentin.  She has a few more days of these medications left.  The rash finally started to scab over in the past few days however she still has a few different ulcers inside the mouth that are tender.  She was worked a few days ago but in the last day or so the pain has subsided to 3 out of 10.  She feel like the gabapentin is helping with the pain.  She is taking gabapentin 3 times a day.  That prescription will run out in a few days.  She denies other body aches or chills, no fever, no nausea or vomiting.  The eye was not involved.  Her ear was quite painful until the rash started scabbing over recently  No prior shingles  She has questions about going back to work.  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  No other aggravating or relieving factors.    No other c/o.  The following portions of the patient's history were reviewed and updated as appropriate: allergies, current medications, past family history, past medical history, past social history, past surgical history and problem list.  ROS Otherwise as in subjective above  Objective: BP 132/78   Pulse 90   Temp 98.5 F (36.9 C)   Wt 266 lb (120.7 kg)   LMP  10/14/2020   BMI 49.45 kg/m   General appearance: alert, no distress, well developed, well nourished She has 2 patches of erythema on the right cheek and right corner of the mouth both with early crusted erythematous lesions from where there was some vesicles recently.  There is also a crusted lesion of the right external ear entering into the meatus.  There is no eye involvement.  No scalp involvement otherwise. She has a few different ulcerations inside the mouth on the right side of the tongue, right inside the lip and right lower gum. Otherwise HEENT: normocephalic, sclerae anicteric, conjunctiva pink and moist     Assessment: Encounter Diagnosis  Name Primary?   Herpes zoster with other complication Yes     Plan: We discussed her symptoms and concerns.  I gave her the following instructions.  We discussed that it is crucial to avoid touching the area and touching other people to avoid transmission to others.  I advised that it would be preferable if she does not return to work for at least several more days until the rash has healed over.  However if she does go back to work in the next few days she needs to be extremely careful to keep the rash covered, to not be touching the face, to wash  hands frequently and thoroughly with soap and water.  Specifically we discussed avoiding high risk groups of people.  She will work with her employer health at work department to decide when she can return to work.  I think it would be wise to wait a few more days before returning to work until the rash has subsided even more.  Fortunately it is starting to scab over at this point.  We discussed possible complications including scarring and postherpetic neuralgia lingering.  She will continue gabapentin at a lower dose for the next month.  Over the next month if much improved or resolved with pain , then we can decide on stopping the gabapentin.  Patient Instructions  Recommendations Stop the  amoxicillin as it probably is not helping and could cause shift in your normal bacteria leading to yeast infection Finish the Valtrex Finish the prednisone After you finish the initial Gabapentin prescription, change to '300mg'$  twice daily for 3-4 weeks Before you run out of the next 30 day script of Gabapentin, let me know how you are feeling. We would likely change to once daily for a fews and then stop, or if you have ongoing pain, we may continue this Continue salt water gargles and rinses Use the OTC remedy for pain of the sores inside the mouth I expect the mouth sores to resolve in the next week Once the facial skin sores have crusted and are turning more of a light pink, then at that point consider using Vitamin E cream, shea butter or other moisturizing lotion such as Aquaphor or Cetaphil Wash hands frequently and thoroughly with soap and water, particularly if you touch your face    Melissa Mason was seen today for other.  Diagnoses and all orders for this visit:  Herpes zoster with other complication  Other orders -     gabapentin (NEURONTIN) 300 MG capsule; Take 1 capsule (300 mg total) by mouth 2 (two) times daily.    Follow up: 31mo

## 2020-11-14 NOTE — Patient Instructions (Signed)
Recommendations Stop the amoxicillin as it probably is not helping and could cause shift in your normal bacteria leading to yeast infection Finish the Valtrex Finish the prednisone After you finish the initial Gabapentin prescription, change to '300mg'$  twice daily for 3-4 weeks Before you run out of the next 30 day script of Gabapentin, let me know how you are feeling. We would likely change to once daily for a fews and then stop, or if you have ongoing pain, we may continue this Continue salt water gargles and rinses Use the OTC remedy for pain of the sores inside the mouth I expect the mouth sores to resolve in the next week Once the facial skin sores have crusted and are turning more of a light pink, then at that point consider using Vitamin E cream, shea butter or other moisturizing lotion such as Aquaphor or Cetaphil Wash hands frequently and thoroughly with soap and water, particularly if you touch your face

## 2020-11-18 ENCOUNTER — Other Ambulatory Visit: Payer: 59

## 2020-11-19 ENCOUNTER — Telehealth: Payer: 59 | Admitting: Hematology and Oncology

## 2020-12-16 ENCOUNTER — Encounter: Payer: Self-pay | Admitting: Internal Medicine

## 2021-01-03 ENCOUNTER — Encounter: Payer: Self-pay | Admitting: Hematology and Oncology

## 2021-01-10 ENCOUNTER — Other Ambulatory Visit: Payer: 59

## 2021-01-10 ENCOUNTER — Other Ambulatory Visit: Payer: Self-pay

## 2021-01-13 ENCOUNTER — Telehealth: Payer: 59 | Admitting: Hematology and Oncology

## 2021-01-23 ENCOUNTER — Telehealth: Payer: Self-pay | Admitting: Hematology and Oncology

## 2021-02-03 ENCOUNTER — Encounter: Payer: Self-pay | Admitting: Hematology and Oncology

## 2021-02-03 ENCOUNTER — Other Ambulatory Visit: Payer: Self-pay

## 2021-02-03 ENCOUNTER — Inpatient Hospital Stay: Payer: BC Managed Care – PPO | Attending: Hematology and Oncology

## 2021-02-03 DIAGNOSIS — D500 Iron deficiency anemia secondary to blood loss (chronic): Secondary | ICD-10-CM | POA: Insufficient documentation

## 2021-02-03 DIAGNOSIS — N920 Excessive and frequent menstruation with regular cycle: Secondary | ICD-10-CM | POA: Insufficient documentation

## 2021-02-03 LAB — CBC WITH DIFFERENTIAL (CANCER CENTER ONLY)
Abs Immature Granulocytes: 0.04 10*3/uL (ref 0.00–0.07)
Basophils Absolute: 0.1 10*3/uL (ref 0.0–0.1)
Basophils Relative: 1 %
Eosinophils Absolute: 0.2 10*3/uL (ref 0.0–0.5)
Eosinophils Relative: 2 %
HCT: 39.3 % (ref 36.0–46.0)
Hemoglobin: 12.7 g/dL (ref 12.0–15.0)
Immature Granulocytes: 0 %
Lymphocytes Relative: 19 %
Lymphs Abs: 2.2 10*3/uL (ref 0.7–4.0)
MCH: 28.2 pg (ref 26.0–34.0)
MCHC: 32.3 g/dL (ref 30.0–36.0)
MCV: 87.1 fL (ref 80.0–100.0)
Monocytes Absolute: 1 10*3/uL (ref 0.1–1.0)
Monocytes Relative: 9 %
Neutro Abs: 8.1 10*3/uL — ABNORMAL HIGH (ref 1.7–7.7)
Neutrophils Relative %: 69 %
Platelet Count: 404 10*3/uL — ABNORMAL HIGH (ref 150–400)
RBC: 4.51 MIL/uL (ref 3.87–5.11)
RDW: 12.7 % (ref 11.5–15.5)
WBC Count: 11.7 10*3/uL — ABNORMAL HIGH (ref 4.0–10.5)
nRBC: 0 % (ref 0.0–0.2)

## 2021-02-04 LAB — IRON AND TIBC
Iron: 35 ug/dL (ref 28–170)
Saturation Ratios: 9 % — ABNORMAL LOW (ref 10.4–31.8)
TIBC: 387 ug/dL (ref 250–450)
UIBC: 352 ug/dL

## 2021-02-04 LAB — FERRITIN: Ferritin: 36 ng/mL (ref 11–307)

## 2021-02-05 NOTE — Progress Notes (Signed)
HEMATOLOGY-ONCOLOGY MYCHART VIDEO VISIT PROGRESS NOTE  I connected with Melissa Mason on 02/06/2021 at  2:30 PM EST by MyChart video conference and verified that I am speaking with the correct person using two identifiers.  I discussed the limitations, risks, security and privacy concerns of performing an evaluation and management service by MyChart and the availability of in person appointments.  I also discussed with the patient that there may be a patient responsible charge related to this service. The patient expressed understanding and agreed to proceed.  Patient's Location: Home Physician Location: Clinic  CHIEF COMPLIANT: Follow-up of anemia  INTERVAL HISTORY: Melissa Mason is a 35 y.o. female with above-mentioned history of anemia. Labs on 06/08 showed Ferritin 36, WBC 11.7, RBC 4.51, HGB 12.7, HCT 39.3, and PLT 404. She presents via MyChart today for follow-up. She does feel fatigued but overall she has improved significantly from where she was.  Her bleeding is also slowed down.  She had 1 heavy period and then since then she has not had any major bleeding issues.  She has noticed some bright red blood per rectum and she will discuss this with her primary care physician regarding hemorrhoids.  Observations/Objective:  There were no vitals filed for this visit. There is no height or weight on file to calculate BMI.  I have reviewed the data as listed CMP Latest Ref Rng & Units 05/21/2020 04/03/2020 08/28/2019  Glucose 70 - 99 mg/dL 158(H) 99 99  BUN 6 - 20 mg/dL 25(H) 15 15  Creatinine 0.44 - 1.00 mg/dL 0.89 0.73 0.71  Sodium 135 - 145 mmol/L 135 139 138  Potassium 3.5 - 5.1 mmol/L 3.8 4.4 4.7  Chloride 98 - 111 mmol/L 100 103 101  CO2 22 - 32 mmol/L 23 23 22   Calcium 8.9 - 10.3 mg/dL 9.0 9.2 9.4  Total Protein 6.0 - 8.5 g/dL - 7.3 7.7  Total Bilirubin 0.0 - 1.2 mg/dL - 0.4 0.3  Alkaline Phos 44 - 121 IU/L - 92 95  AST 0 - 40 IU/L - 13 13  ALT 0 - 32 IU/L - 16 14    Lab  Results  Component Value Date   WBC 11.7 (H) 02/03/2021   HGB 12.7 02/03/2021   HCT 39.3 02/03/2021   MCV 87.1 02/03/2021   PLT 404 (H) 02/03/2021   NEUTROABS 8.1 (H) 02/03/2021      Assessment Plan:  Iron deficiency anemia due to chronic blood loss 08/14/2020: Hemoglobin 10.5, MCV 76.4 platelets 556, ferritin 10, iron saturation 5%, TIBC 375, vitamin D improved to 31.3 from 11.1 02/03/2021: Hemoglobin 12.7, MCV 87, platelets 404, iron saturation 9%, ferritin 36   Most likely cause of iron deficiency anemia: Uterine bleeding Endometrial biopsy: Benign   Prior IV iron treatment: 09/03/2020 Venofer 4 doses  Remarkable improvement in hemoglobin and iron studies. Bright red blood per rectum: Patient will discuss this with her primary care physician.  It could be related to hemorrhoids.  Recheck labs in 3 months and follow-up after that    I discussed the assessment and treatment plan with the patient. The patient was provided an opportunity to ask questions and all were answered. The patient agreed with the plan and demonstrated an understanding of the instructions. The patient was advised to call back or seek an in-person evaluation if the symptoms worsen or if the condition fails to improve as anticipated.   Total time spent: 20 minutes including face-to-face MyChart video visit time and time spent for planning,  charting and coordination of care  Rulon Eisenmenger, MD 02/06/2021  I, Thana Ates am acting as scribe for Nicholas Lose, MD.  I have reviewed the above documentation for accuracy and completeness, and I agree with the above.

## 2021-02-06 ENCOUNTER — Telehealth: Payer: BC Managed Care – PPO | Admitting: Hematology and Oncology

## 2021-02-06 DIAGNOSIS — D500 Iron deficiency anemia secondary to blood loss (chronic): Secondary | ICD-10-CM

## 2021-02-06 NOTE — Assessment & Plan Note (Signed)
08/14/2020: Hemoglobin 10.5, MCV 76.4 platelets 556, ferritin 10, iron saturation 5%, TIBC 375, vitamin D improved to 31.3 from 11.1 02/03/2021: Hemoglobin 12.7, MCV 87, platelets 404, iron saturation 9%, ferritin 36  Most likely cause of iron deficiency anemia: Uterine bleeding Endometrial biopsy: Benign  Prior IV iron treatment: 09/03/2020 Venofer 4 doses  Recheck labs in 3 months and follow-up after that

## 2021-03-25 ENCOUNTER — Other Ambulatory Visit: Payer: Self-pay | Admitting: Family Medicine

## 2021-03-25 ENCOUNTER — Other Ambulatory Visit: Payer: Self-pay | Admitting: Medical

## 2021-03-25 ENCOUNTER — Encounter: Payer: Self-pay | Admitting: Obstetrics and Gynecology

## 2021-03-25 DIAGNOSIS — J301 Allergic rhinitis due to pollen: Secondary | ICD-10-CM

## 2021-03-26 ENCOUNTER — Other Ambulatory Visit (HOSPITAL_COMMUNITY): Payer: Self-pay

## 2021-03-26 ENCOUNTER — Other Ambulatory Visit: Payer: Self-pay | Admitting: Medical

## 2021-03-26 ENCOUNTER — Encounter: Payer: Self-pay | Admitting: Hematology and Oncology

## 2021-03-26 MED ORDER — BUSPIRONE HCL 10 MG PO TABS
10.0000 mg | ORAL_TABLET | Freq: Every day | ORAL | 0 refills | Status: DC
  Filled 2021-03-26: qty 30, 30d supply, fill #0

## 2021-03-26 MED ORDER — ESCITALOPRAM OXALATE 20 MG PO TABS
20.0000 mg | ORAL_TABLET | Freq: Every day | ORAL | 0 refills | Status: DC
  Filled 2021-03-26: qty 30, 30d supply, fill #0

## 2021-03-26 MED ORDER — MONTELUKAST SODIUM 10 MG PO TABS
10.0000 mg | ORAL_TABLET | Freq: Every day | ORAL | 0 refills | Status: DC
  Filled 2021-03-26: qty 30, 30d supply, fill #0

## 2021-03-27 ENCOUNTER — Other Ambulatory Visit (HOSPITAL_COMMUNITY): Payer: Self-pay

## 2021-04-09 ENCOUNTER — Encounter: Payer: Self-pay | Admitting: Hematology and Oncology

## 2021-04-09 ENCOUNTER — Other Ambulatory Visit (HOSPITAL_COMMUNITY): Payer: Self-pay

## 2021-04-09 ENCOUNTER — Ambulatory Visit: Payer: BC Managed Care – PPO | Admitting: Medical

## 2021-04-09 VITALS — BP 118/64 | HR 79 | Ht 61.5 in | Wt 265.8 lb

## 2021-04-09 DIAGNOSIS — R7301 Impaired fasting glucose: Secondary | ICD-10-CM

## 2021-04-09 DIAGNOSIS — D500 Iron deficiency anemia secondary to blood loss (chronic): Secondary | ICD-10-CM

## 2021-04-09 DIAGNOSIS — J301 Allergic rhinitis due to pollen: Secondary | ICD-10-CM | POA: Diagnosis not present

## 2021-04-09 DIAGNOSIS — E559 Vitamin D deficiency, unspecified: Secondary | ICD-10-CM

## 2021-04-09 DIAGNOSIS — Z6841 Body Mass Index (BMI) 40.0 and over, adult: Secondary | ICD-10-CM

## 2021-04-09 DIAGNOSIS — F411 Generalized anxiety disorder: Secondary | ICD-10-CM

## 2021-04-09 DIAGNOSIS — R41840 Attention and concentration deficit: Secondary | ICD-10-CM | POA: Insufficient documentation

## 2021-04-09 DIAGNOSIS — J4520 Mild intermittent asthma, uncomplicated: Secondary | ICD-10-CM | POA: Diagnosis not present

## 2021-04-09 MED ORDER — VITAMIN D 25 MCG (1000 UNIT) PO TABS
1000.0000 [IU] | ORAL_TABLET | Freq: Every day | ORAL | 3 refills | Status: DC
  Filled 2021-04-09: qty 90, 90d supply, fill #0

## 2021-04-09 MED ORDER — MONTELUKAST SODIUM 10 MG PO TABS
10.0000 mg | ORAL_TABLET | Freq: Every day | ORAL | 3 refills | Status: DC
  Filled 2021-04-09: qty 90, 90d supply, fill #0

## 2021-04-09 MED ORDER — ESCITALOPRAM OXALATE 20 MG PO TABS
20.0000 mg | ORAL_TABLET | Freq: Every day | ORAL | 2 refills | Status: DC
  Filled 2021-04-09 – 2021-06-23 (×2): qty 30, 30d supply, fill #0
  Filled 2021-08-23: qty 30, 30d supply, fill #1
  Filled 2021-09-29: qty 30, 30d supply, fill #2

## 2021-04-09 MED ORDER — ALBUTEROL SULFATE HFA 108 (90 BASE) MCG/ACT IN AERS
2.0000 | INHALATION_SPRAY | Freq: Four times a day (QID) | RESPIRATORY_TRACT | 1 refills | Status: DC | PRN
  Filled 2021-04-09: qty 18, 25d supply, fill #0

## 2021-04-09 MED ORDER — WEGOVY 0.25 MG/0.5ML ~~LOC~~ SOAJ
0.2500 mg | SUBCUTANEOUS | 0 refills | Status: DC
  Filled 2021-04-09 – 2021-04-14 (×3): qty 2, 28d supply, fill #0

## 2021-04-09 NOTE — Progress Notes (Signed)
Subjective:  Melissa Mason is a 36 y.o. female who presents for Chief Complaint  Patient presents with   med check    Med check. Currently being treatment for pink eye.      Here for several concerns.  Needs refills for asthma and allergy medication.  Lately she has not had any flareups and has not had to use inhaler recently.  She is compliant with Singulair daily, Zyrtec daily.  She wants help losing weight.  She is active around the house, has young kids that she chases after.  She knows she needs to do better with diet.  She would like to try Ozempic or something similar to help with weight loss.  She has read about this.  She has had prediabetes or impaired glucose numbers before.  No family history of thyroid cancer.  She does not drink any sugary drinks or alcohol.  Vitamin D deficiency-she is not currently taking vitamin D supplement.  She would like a refill on this medication  History of anxiety and depression-doing okay on current therapy.  She takes Lexapro daily.  She has been doing BuSpar once daily.  She is not sure if she needs to stay on this or not.  She actually wonders about ADD or attention issues.  She often gets distracted, she has trouble concentrating, she interrupts people sometimes without thinking.  She does not necessarily  thinks she had this issue in her childhood  No other aggravating or relieving factors.    No other c/o.  Past Medical History:  Diagnosis Date   Anxiety    Asthma    Depression    Heartburn    Pre-diabetes    Reactive airway disease    Seasonal allergies    Vitamin D deficiency    Current Outpatient Medications on File Prior to Visit  Medication Sig Dispense Refill   busPIRone (BUSPAR) 10 MG tablet Take 1 tablet (10 mg total) by mouth daily. 30 tablet 0   cetirizine (ZYRTEC) 10 MG tablet Take 10 mg by mouth daily.     medroxyPROGESTERone (PROVERA) 5 MG tablet Take one tablet a day for 5 days every month if no spontaneous cycle 15  tablet 3   No current facility-administered medications on file prior to visit.     The following portions of the patient's history were reviewed and updated as appropriate: allergies, current medications, past family history, past medical history, past social history, past surgical history and problem list.  ROS Otherwise as in subjective above    Objective: BP 118/64    Pulse 79    Ht 5' 1.5" (1.562 m)    Wt 265 lb 12.8 oz (120.6 kg)    BMI 49.41 kg/m   General appearance: alert, no distress, well developed, well nourished Neck: supple, no lymphadenopathy, no thyromegaly, no masses Heart: RRR, normal S1, S2, no murmurs Lungs: CTA bilaterally, no wheezes, rhonchi, or rales Pulses: 2+ radial pulses, 2+ pedal pulses, normal cap refill Ext: no edema   Assessment: Encounter Diagnoses  Name Primary?   Mild intermittent asthma, unspecified whether complicated Yes   Vitamin D deficiency    Seasonal allergic rhinitis due to pollen    Impaired fasting blood sugar    Iron deficiency anemia due to chronic blood loss    Generalized anxiety disorder    BMI 45.0-49.9, adult (HCC)    Attention and concentration deficit      Plan: Asthma and allergies-continue Zyrtec, continue Singulair, albuterol as needed  Vitamin D deficiency-restart vitamin D supplement, counseled on sun exposure and diet  Impaired glucose-returns soon for fasting physical and labs  Anxiety, attention concentration deficit-she is going to continue Lexapro but she is willing to wean down on BuSpar by taking 1/2 tablet daily for 2 weeks then 1/2 tablet every other day for a week or 2 and then stop.  At her next visit we will do an ADD questionnaire screening.  We discussed possibly seeing a counselor, we discussed having a consistent routine and using strategies to help with focus and attention  Obesity-counseled on diet and exercise.  We discussed possible medications to help.  Begin trial of Wegovy.  Discussed  risk and benefits and proper use of Wegovy  Iron deficiency anemia-managed by hematology  Mayan was seen today for med check.  Diagnoses and all orders for this visit:  Mild intermittent asthma, unspecified whether complicated -     albuterol (VENTOLIN HFA) 108 (90 Base) MCG/ACT inhaler; INHALE 2 PUFFS INTO THE LUNGS EVERY 6 (SIX) HOURS AS NEEDED FOR WHEEZING OR SHORTNESS OF BREATH.  Vitamin D deficiency  Seasonal allergic rhinitis due to pollen -     montelukast (SINGULAIR) 10 MG tablet; Take 1 tablet (10 mg total) by mouth at bedtime.  Impaired fasting blood sugar  Iron deficiency anemia due to chronic blood loss  Generalized anxiety disorder  BMI 45.0-49.9, adult (HCC)  Attention and concentration deficit  Other orders -     cholecalciferol (VITAMIN D3) 25 MCG (1000 UNIT) tablet; Take 1 tablet (1,000 Units total) by mouth daily. -     Semaglutide-Weight Management (WEGOVY) 0.25 MG/0.5ML SOAJ; Inject 0.25 mg into the skin once a week. -     escitalopram (LEXAPRO) 20 MG tablet; Take 1 tablet (20 mg total) by mouth daily.    Follow up: soon as planned for fasting physical

## 2021-04-09 NOTE — Telephone Encounter (Signed)
Dr.Jertson patient did not read message and it returned. Should her annual exam be in June 2023? I will call patient and let her know.

## 2021-04-09 NOTE — Telephone Encounter (Signed)
I agree, she should have an annual exam in 6/23. She doesn't need a pap yet.

## 2021-04-10 ENCOUNTER — Other Ambulatory Visit (HOSPITAL_COMMUNITY): Payer: Self-pay

## 2021-04-10 NOTE — Telephone Encounter (Signed)
Message sent to appointments to schedule. 

## 2021-04-11 ENCOUNTER — Telehealth: Payer: Self-pay

## 2021-04-11 ENCOUNTER — Other Ambulatory Visit (HOSPITAL_COMMUNITY): Payer: Self-pay

## 2021-04-11 NOTE — Telephone Encounter (Signed)
Pt called and states insurance is requiring P.A. states has not tried any meds for weight loss.   P.A. [employer] completed

## 2021-04-14 ENCOUNTER — Other Ambulatory Visit (HOSPITAL_COMMUNITY): Payer: Self-pay

## 2021-04-14 NOTE — Telephone Encounter (Signed)
Error the P.A. was for [facility] not [employer], P.[employer]. approved til 11/09/21, left message for pt and also advised discount card available online.  Faxed pharmacy

## 2021-04-14 NOTE — Telephone Encounter (Signed)
Message sent from [place] "Pt did not return call. "

## 2021-04-15 ENCOUNTER — Encounter: Payer: Self-pay | Admitting: Hematology and Oncology

## 2021-04-21 NOTE — Telephone Encounter (Signed)
Recall letter placed for June. Patient has not read my chart nor called the office back.

## 2021-05-09 ENCOUNTER — Encounter: Payer: Self-pay | Admitting: Medical

## 2021-05-09 ENCOUNTER — Other Ambulatory Visit (HOSPITAL_COMMUNITY): Payer: Self-pay

## 2021-05-09 ENCOUNTER — Ambulatory Visit (INDEPENDENT_AMBULATORY_CARE_PROVIDER_SITE_OTHER): Payer: BC Managed Care – PPO | Admitting: Medical

## 2021-05-09 VITALS — BP 120/80 | HR 100 | Ht 62.25 in | Wt 263.4 lb

## 2021-05-09 DIAGNOSIS — Z7185 Encounter for immunization safety counseling: Secondary | ICD-10-CM

## 2021-05-09 DIAGNOSIS — E559 Vitamin D deficiency, unspecified: Secondary | ICD-10-CM | POA: Diagnosis not present

## 2021-05-09 DIAGNOSIS — N921 Excessive and frequent menstruation with irregular cycle: Secondary | ICD-10-CM

## 2021-05-09 DIAGNOSIS — J301 Allergic rhinitis due to pollen: Secondary | ICD-10-CM

## 2021-05-09 DIAGNOSIS — Z23 Encounter for immunization: Secondary | ICD-10-CM

## 2021-05-09 DIAGNOSIS — Z0000 Encounter for general adult medical examination without abnormal findings: Secondary | ICD-10-CM

## 2021-05-09 DIAGNOSIS — J4520 Mild intermittent asthma, uncomplicated: Secondary | ICD-10-CM

## 2021-05-09 DIAGNOSIS — Z6841 Body Mass Index (BMI) 40.0 and over, adult: Secondary | ICD-10-CM

## 2021-05-09 DIAGNOSIS — F411 Generalized anxiety disorder: Secondary | ICD-10-CM

## 2021-05-09 DIAGNOSIS — R7301 Impaired fasting glucose: Secondary | ICD-10-CM

## 2021-05-09 MED ORDER — WEGOVY 0.5 MG/0.5ML ~~LOC~~ SOAJ
0.5000 mg | SUBCUTANEOUS | 1 refills | Status: DC
  Filled 2021-05-09: qty 2, 28d supply, fill #0
  Filled 2021-06-05: qty 2, 28d supply, fill #1

## 2021-05-09 NOTE — Telephone Encounter (Signed)
done

## 2021-05-09 NOTE — Progress Notes (Signed)
Subjective:   HPI  Melissa Mason is a 36 y.o. female who presents for Chief Complaint  Patient presents with   fasting cpe    Fasitng cpe, sees obgyn- Nadene Rubins    Patient Care Team: Caryl Ada as PCP - General (Family Medicine) Sees dentist Sees eye doctor Dr. Sumner Boast, gynecology Dr. Nicholas Lose, hematology    Concerns: No significant concerns today  Here to recheck on iron and blood counts given history of iron deficiency  She is using the [facility] injection help her weight loss efforts.  No particular side effects  She has history of heavy periods, sees gynecology.  She has had an occasional bright red blood in the stool attributed to hemorrhoids.  Gets anal itching every now and then.  This has been a rare occurrence though with blood.   Reviewed their medical, surgical, family, social, medication, and allergy history and updated chart as appropriate.  Past Medical History:  Diagnosis Date   Anxiety    Asthma    Depression    Heartburn    Pre-diabetes    Reactive airway disease    Seasonal allergies    Vitamin D deficiency     Family History  Problem Relation Age of Onset   Depression Mother    Obesity Mother    Hypertension Father    Sleep apnea Father    Obesity Father    Cancer Father        skin, basal   Cancer Maternal Grandmother        breast   Diabetes Maternal Grandmother    Cancer Maternal Grandfather        prostate   Cancer Paternal Grandmother        breast, lymphoma, skin   Alzheimer's disease Paternal Grandfather    Diabetes Paternal Grandfather    Heart disease Neg Hx    Stroke Neg Hx      Current Outpatient Medications:    albuterol (VENTOLIN HFA) 108 (90 Base) MCG/ACT inhaler, Inhale 2 puffs into the lungs every 6 (six) hours as needed for wheezing or shortness of breath., Disp: 18 g, Rfl: 1   cetirizine (ZYRTEC) 10 MG tablet, Take 10 mg by mouth daily., Disp: , Rfl:    cholecalciferol (VITAMIN D3) 25  MCG (1000 UNIT) tablet, Take 1 tablet (1,000 Units total) by mouth daily., Disp: 90 tablet, Rfl: 3   escitalopram (LEXAPRO) 20 MG tablet, Take 1 tablet (20 mg total) by mouth daily., Disp: 30 tablet, Rfl: 2   Semaglutide-Weight Management (WEGOVY) 0.5 MG/0.5ML SOAJ, Inject 0.5 mg into the skin once a week., Disp: 2 mL, Rfl: 1  Allergies  Allergen Reactions   Pollen Extract Other (See Comments)    Sneezing sneezing      Review of Systems Constitutional: -fever, -chills, -sweats, -unexpected weight change, -decreased appetite, -fatigue Allergy: -sneezing, -itching, -congestion Dermatology: -changing moles, --rash, -lumps ENT: -runny nose, -ear pain, -sore throat, -hoarseness, -sinus pain, -teeth pain, - ringing in ears, -hearing loss, -nosebleeds Cardiology: -chest pain, -palpitations, -swelling, -difficulty breathing when lying flat, -waking up short of breath Respiratory: -cough, -shortness of breath, -difficulty breathing with exercise or exertion, -wheezing, -coughing up blood Gastroenterology: -abdominal pain, -nausea, -vomiting, -diarrhea, -constipation, -blood in stool, -changes in bowel movement, -difficulty swallowing or eating Hematology: -bleeding, -bruising  Musculoskeletal: -joint aches, -muscle aches, -joint swelling, -back pain, -neck pain, -cramping, -changes in gait Ophthalmology: denies vision changes, eye redness, itching, discharge Urology: -burning with urination, -difficulty urinating, -blood  in urine, -urinary frequency, -urgency, -incontinence Neurology: -headache, -weakness, -tingling, -numbness, -memory loss, -falls, -dizziness Psychology: -depressed mood, -agitation, -sleep problems Breast/gyn: -breast tendnerss, -discharge, -lumps, -vaginal discharge,- irregular periods, -heavy periods   Depression screen [facility] 2/9 04/09/2021 11/14/2020 09/25/2019 08/28/2019  Decreased Interest 0 0 0 1  Down, Depressed, Hopeless 0 0 0 2  PHQ - 2 Score 0 0 0 3  Altered sleeping - -  - 2  Tired, decreased energy - - - 3  Change in appetite - - - 2  Feeling bad or failure about yourself  - - - 3  Trouble concentrating - - - 3  Moving slowly or fidgety/restless - - - 0  Suicidal thoughts - - - 0  PHQ-9 Score - - - 16  Difficult doing work/chores - - - Not difficult at all       Objective:  BP 120/80    Pulse 100    Ht 5' 2.25" (1.581 m)    Wt 263 lb 6.4 oz (119.5 kg)    BMI 47.79 kg/m   General appearance: alert, no distress, WD/WN, Caucasian female Skin: unremarkable HEENT: normocephalic, conjunctiva/corneas normal, sclerae anicteric, PERRLA, EOMi, Neck: supple, no lymphadenopathy, no thyromegaly, no masses, normal ROM, no bruits Chest: non tender, normal shape and expansion Heart: RRR, normal S1, S2, no murmurs Lungs: CTA bilaterally, no wheezes, rhonchi, or rales Abdomen: +bs, soft, non tender, non distended, no masses, no hepatomegaly, no splenomegaly, no bruits Back: non tender, normal ROM, no scoliosis Musculoskeletal: upper extremities non tender, no obvious deformity, normal ROM throughout, lower extremities non tender, no obvious deformity, normal ROM throughout Extremities: no edema, no cyanosis, no clubbing Pulses: 2+ symmetric, upper and lower extremities, normal cap refill Neurological: alert, oriented x 3, CN2-12 intact, strength normal upper extremities and lower extremities, sensation normal throughout, DTRs 2+ throughout, no cerebellar signs, gait normal Psychiatric: normal affect, behavior normal, pleasant  Breast/gyn/rectal - deferred to gynecology     Assessment and Plan :   Encounter Diagnoses  Name Primary?   Need for pneumococcal vaccination Yes   Encounter for health maintenance examination in adult    Vitamin D deficiency    Vaccine counseling    Seasonal allergic rhinitis due to pollen    Mild intermittent asthma, unspecified whether complicated    BMI 18.5-63.1, adult (HCC)    Generalized anxiety disorder    Impaired fasting  blood sugar    Menorrhagia with irregular cycle      This visit was a preventative care visit, also known as wellness visit or routine physical.   Topics typically include healthy lifestyle, diet, exercise, preventative care, vaccinations, sick and well care, proper use of emergency dept and after hours care, as well as other concerns.     Recommendations: Continue to return yearly for your annual wellness and preventative care visits.  This gives [place] a chance to discuss healthy lifestyle, exercise, vaccinations, review your chart record, and perform screenings where appropriate.  I recommend you see your eye doctor yearly for routine vision care.  I recommend you see your dentist yearly for routine dental care including hygiene visits twice yearly.  See your gynecologist yearly for routine gynecological care.     Vaccination recommendations were reviewed Immunization History  Administered Date(s) Administered   Hepatitis B 12/06/1995, 01/10/1996, 06/12/1996, 03/18/2015   Influenza Split 07/19/2012, 12/06/2020   Influenza,inj,quad, With Preservative 11/26/2017   Influenza-Unspecified 12/23/2014, 12/02/2015, 11/30/2016, 11/26/2017, 12/02/2018, 12/01/2019   MMR 03/28/1986, 01/21/1993   PFIZER(Purple Top)SARS-COV-2  Vaccination 03/01/2019, 03/20/2019, 12/15/2019   Tdap 05/12/2010, 11/07/2020   Vaxelis (DTaP,IPV,Hib,HepB) 10/19/2007    Counseled on the pneumococcal vaccine.  Vaccine information sheet given.  Pneumococcal vaccine PPSV23 given after consent obtained.    Screening for cancer: Colon cancer screening: Age 79  Breast cancer screening: You should perform a self breast exam monthly.   We reviewed recommendations for regular mammograms and breast cancer screening.  Cervical cancer screening: We reviewed recommendations for pap smear screening.   Skin cancer screening: Check your skin regularly for new changes, growing lesions, or other lesions of concern Come in  for evaluation if you have skin lesions of concern.  Lung cancer screening: If you have a greater than 20 pack year history of tobacco use, then you may qualify for lung cancer screening with a chest CT scan.   Please call your insurance company to inquire about coverage for this test.  We currently don't have screenings for other cancers besides breast, cervical, colon, and lung cancers.  If you have a strong family history of cancer or have other cancer screening concerns, please let me know.    Bone health: Get at least 150 minutes of aerobic exercise weekly Get weight bearing exercise at least once weekly Bone density test:  A bone density test is an imaging test that uses a type of X-ray to measure the amount of calcium and other minerals in your bones. The test may be used to diagnose or screen you for a condition that causes weak or thin bones (osteoporosis), predict your risk for a broken bone (fracture), or determine how well your osteoporosis treatment is working. The bone density test is recommended for females 28 and older, or females or males <56 if certain risk factors such as thyroid disease, long term use of steroids such as for asthma or rheumatological issues, vitamin D deficiency, estrogen deficiency, family history of osteoporosis, self or family history of fragility fracture in first degree relative.    Heart health: Get at least 150 minutes of aerobic exercise weekly Limit alcohol It is important to maintain a healthy blood pressure and healthy cholesterol numbers  Heart disease screening: Screening for heart disease includes screening for blood pressure, fasting lipids, glucose/diabetes screening, BMI height to weight ratio, reviewed of smoking status, physical activity, and diet.    Goals include blood pressure 120/80 or less, maintaining a healthy lipid/cholesterol profile, preventing diabetes or keeping diabetes numbers under good control, not smoking or using  tobacco products, exercising most days per week or at least 150 minutes per week of exercise, and eating healthy variety of fruits and vegetables, healthy oils, and avoiding unhealthy food choices like fried food, fast food, high sugar and high cholesterol foods.    Other tests may possibly include EKG test, CT coronary calcium score, echocardiogram, exercise treadmill stress test.     Medical care options: I recommend you continue to seek care here first for routine care.  We try really hard to have available appointments Monday through Friday daytime hours for sick visits, acute visits, and physicals.  Urgent care should be used for after hours and weekends for significant issues that cannot wait till the next day.  The emergency department should be used for significant potentially life-threatening emergencies.  The emergency department is expensive, can often have long wait times for less significant concerns, so try to utilize primary care, urgent care, or telemedicine when possible to avoid unnecessary trips to the emergency department.  Virtual visits and telemedicine  have been introduced since the pandemic started in 2020, and can be convenient ways to receive medical care.  We offer virtual appointments as well to assist you in a variety of options to seek medical care.    Separate significant issues discussed: Asthma-mild intermittent, continue albuterol as needed.  We discussed potentially using inhaled corticosteroids if symptoms get worse during allergy season but she has not typically had to use this.  Has never been on a preventative before.  Vitamin D deficiency-labs today, continue supplement  Obesity-continue efforts to lose weight through healthy diet and exercise.  Increase to the Wegovy 0.5 mg weekly  History of iron deficiency, menorrhagia-follow-up with gynecology, labs today to check iron and CBC  Blood in the stool rarely, presumed to be hemorrhoid related.  We discussed  acute therapy for flareup of hemorrhoids.  If she continues to see blood on a more regular basis then consider GI consult.  [place] was seen today for fasting cpe.  Diagnoses and all orders for this visit:  Need for pneumococcal vaccination  Encounter for health maintenance examination in adult -     Comprehensive metabolic panel -     Hemoglobin A1c -     VITAMIN D 25 Hydroxy (Vit-D Deficiency, Fractures) -     Iron, TIBC and Ferritin Panel -     CBC -     Lipid panel  Vitamin D deficiency -     VITAMIN D 25 Hydroxy (Vit-D Deficiency, Fractures)  Vaccine counseling  Seasonal allergic rhinitis due to pollen  Mild intermittent asthma, unspecified whether complicated  BMI 28.7-68.1, adult (HCC)  Generalized anxiety disorder  Impaired fasting blood sugar -     Hemoglobin A1c  Menorrhagia with irregular cycle -     CBC  Other orders -     Semaglutide-Weight Management (WEGOVY) 0.5 MG/0.5ML SOAJ; Inject 0.5 mg into the skin once a week.    Follow-up pending labs, yearly for physical

## 2021-05-10 LAB — IRON,TIBC AND FERRITIN PANEL
Ferritin: 34 ng/mL (ref 15–150)
Iron Saturation: 13 % — ABNORMAL LOW (ref 15–55)
Iron: 45 ug/dL (ref 27–159)
Total Iron Binding Capacity: 343 ug/dL (ref 250–450)
UIBC: 298 ug/dL (ref 131–425)

## 2021-05-10 LAB — COMPREHENSIVE METABOLIC PANEL
ALT: 15 IU/L (ref 0–32)
AST: 16 IU/L (ref 0–40)
Albumin/Globulin Ratio: 1.6 (ref 1.2–2.2)
Albumin: 4.6 g/dL (ref 3.8–4.8)
Alkaline Phosphatase: 86 IU/L (ref 44–121)
BUN/Creatinine Ratio: 24 — ABNORMAL HIGH (ref 9–23)
BUN: 16 mg/dL (ref 6–20)
Bilirubin Total: 0.3 mg/dL (ref 0.0–1.2)
CO2: 19 mmol/L — ABNORMAL LOW (ref 20–29)
Calcium: 9.3 mg/dL (ref 8.7–10.2)
Chloride: 103 mmol/L (ref 96–106)
Creatinine, Ser: 0.68 mg/dL (ref 0.57–1.00)
Globulin, Total: 2.9 g/dL (ref 1.5–4.5)
Glucose: 83 mg/dL (ref 70–99)
Potassium: 4.3 mmol/L (ref 3.5–5.2)
Sodium: 140 mmol/L (ref 134–144)
Total Protein: 7.5 g/dL (ref 6.0–8.5)
eGFR: 116 mL/min/{1.73_m2} (ref >59)

## 2021-05-10 LAB — CBC
Hematocrit: 34.6 % (ref 34.0–46.6)
Hemoglobin: 11.8 g/dL (ref 11.1–15.9)
MCH: 28.1 pg (ref 26.6–33.0)
MCHC: 34.1 g/dL (ref 31.5–35.7)
MCV: 82 fL (ref 79–97)
Platelets: 479 10*3/uL — ABNORMAL HIGH (ref 150–450)
RBC: 4.2 x10E6/uL (ref 3.77–5.28)
RDW: 12.6 % (ref 11.7–15.4)
WBC: 11.8 10*3/uL — ABNORMAL HIGH (ref 3.4–10.8)

## 2021-05-10 LAB — HEMOGLOBIN A1C
Est. average glucose Bld gHb Est-mCnc: 114 mg/dL
Hgb A1c MFr Bld: 5.6 % (ref 4.8–5.6)

## 2021-05-10 LAB — LIPID PANEL
Chol/HDL Ratio: 3.8 ratio (ref 0.0–4.4)
Cholesterol, Total: 182 mg/dL (ref 100–199)
HDL: 48 mg/dL (ref >39)
LDL Chol Calc (NIH): 122 mg/dL — ABNORMAL HIGH (ref 0–99)
Triglycerides: 62 mg/dL (ref 0–149)
VLDL Cholesterol Cal: 12 mg/dL (ref 5–40)

## 2021-05-10 LAB — VITAMIN D 25 HYDROXY (VIT D DEFICIENCY, FRACTURES): Vit D, 25-Hydroxy: 26.1 ng/mL — ABNORMAL LOW (ref 30.0–100.0)

## 2021-05-12 ENCOUNTER — Other Ambulatory Visit: Payer: Self-pay | Admitting: Medical

## 2021-05-12 ENCOUNTER — Other Ambulatory Visit (HOSPITAL_COMMUNITY): Payer: Self-pay

## 2021-05-12 ENCOUNTER — Encounter: Payer: Self-pay | Admitting: Hematology and Oncology

## 2021-05-12 MED ORDER — VITAMIN D 50 MCG (2000 UT) PO CAPS
1.0000 | ORAL_CAPSULE | Freq: Every day | ORAL | 1 refills | Status: DC
  Filled 2021-05-12: qty 90, 90d supply, fill #0

## 2021-05-12 MED ORDER — FERROUS GLUCONATE 324 (38 FE) MG PO TABS
324.0000 mg | ORAL_TABLET | Freq: Every day | ORAL | 2 refills | Status: DC
  Filled 2021-05-12: qty 60, 60d supply, fill #0

## 2021-06-06 ENCOUNTER — Other Ambulatory Visit (HOSPITAL_COMMUNITY): Payer: Self-pay

## 2021-06-10 ENCOUNTER — Other Ambulatory Visit (HOSPITAL_COMMUNITY): Payer: Self-pay

## 2021-06-11 ENCOUNTER — Other Ambulatory Visit (HOSPITAL_COMMUNITY): Payer: Self-pay

## 2021-06-11 ENCOUNTER — Other Ambulatory Visit: Payer: Self-pay | Admitting: Medical

## 2021-06-11 MED ORDER — WEGOVY 1 MG/0.5ML ~~LOC~~ SOAJ
1.0000 mg | SUBCUTANEOUS | 1 refills | Status: DC
  Filled 2021-06-11: qty 2, 28d supply, fill #0

## 2021-06-23 ENCOUNTER — Other Ambulatory Visit (HOSPITAL_COMMUNITY): Payer: Self-pay

## 2021-07-07 ENCOUNTER — Other Ambulatory Visit: Payer: Self-pay | Admitting: Medical

## 2021-07-07 ENCOUNTER — Other Ambulatory Visit (HOSPITAL_COMMUNITY): Payer: Self-pay

## 2021-07-07 MED ORDER — WEGOVY 1.7 MG/0.75ML ~~LOC~~ SOAJ
1.7000 mg | SUBCUTANEOUS | 1 refills | Status: DC
  Filled 2021-07-07: qty 3, 28d supply, fill #0

## 2021-07-11 ENCOUNTER — Other Ambulatory Visit (HOSPITAL_COMMUNITY): Payer: Self-pay

## 2021-07-14 ENCOUNTER — Other Ambulatory Visit (HOSPITAL_COMMUNITY): Payer: Self-pay

## 2021-08-23 ENCOUNTER — Other Ambulatory Visit (HOSPITAL_COMMUNITY): Payer: Self-pay

## 2021-09-25 ENCOUNTER — Ambulatory Visit: Payer: BC Managed Care – PPO | Admitting: Obstetrics and Gynecology

## 2021-09-29 ENCOUNTER — Other Ambulatory Visit (HOSPITAL_BASED_OUTPATIENT_CLINIC_OR_DEPARTMENT_OTHER): Payer: Self-pay

## 2021-09-29 NOTE — Progress Notes (Unsigned)
36 y.o. G0P0000 Married White or Caucasian Not Hispanic or Latino female here for annual exam.  Cycles are every 1-3 months x 10-14 days. Period Pattern: (!) Irregular Menstrual Flow: Heavy Menstrual Control: Maxi pad Menstrual Control Change Freq (Hours): 2 Dysmenorrhea: (!) Mild Dysmenorrhea Symptoms: Cramping  Last year she was evaluated for menometrorrhagia and anemia.  An endometrial biopsy returned with proliferative endometrium. Hgb was 10.5, ferritin was 10, prolactin was 9, TSH was 1.78. She has had 3 iron transfusions since that visit.  She was given a script for provera and options of cycle control were discussed.  She has only taken the provera 1 x, didn't repeat it because her cycle was so heavy. She would be willing to try the [place] IUD.   Patient's last menstrual period was 09/18/2021 (approximate).   09/18/21       Sexually active: Yes.    The current method of family planning is female partner. .    Exercising: No.   Smoker:  no  Health Maintenance: Pap:   10/27/17  WNL HPV Neg ([place]) History of abnormal Pap:  no MMG:  none  BMD:   none  Colonoscopy: none TDaP:  11/07/20  Gardasil: unsure   reports that she has never smoked. She has never used smokeless tobacco. She reports current alcohol use. She reports that she does not use drugs. No ETOH. 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 Kids are 5 and 2.   Past Medical History:  Diagnosis Date   Anxiety    Asthma    Depression    Heartburn    Pre-diabetes    Reactive airway disease    Seasonal allergies    Vitamin D deficiency     Past Surgical History:  Procedure Laterality Date   WISDOM TOOTH EXTRACTION      Current Outpatient Medications  Medication Sig Dispense Refill   albuterol (VENTOLIN HFA) 108 (90 Base) MCG/ACT inhaler Inhale 2 puffs into the lungs every 6 (six) hours as needed for wheezing or shortness of breath. 18 g 1   cetirizine (ZYRTEC) 10 MG  tablet Take 10 mg by mouth daily.     Cholecalciferol (VITAMIN D) 50 MCG (2000 UT) CAPS Take 1 capsule (2,000 Units total) by mouth daily. 90 capsule 1   escitalopram (LEXAPRO) 20 MG tablet Take 1 tablet (20 mg total) by mouth daily. 30 tablet 2   ferrous gluconate (FERGON) 324 MG tablet Take 1 tablet (324 mg total) by mouth daily with breakfast. 60 tablet 2   No current facility-administered medications for this visit.    Family History  Problem Relation Age of Onset   Depression Mother    Obesity Mother    Hypertension Father    Sleep apnea Father    Obesity Father    Cancer Father        skin, basal   Cancer Maternal Grandmother        breast   Diabetes Maternal Grandmother    Cancer Maternal Grandfather        prostate   Cancer Paternal Grandmother        breast, lymphoma, skin   Alzheimer's disease Paternal Grandfather    Diabetes Paternal Grandfather    Heart disease Neg Hx    Stroke Neg Hx     Review of Systems  All other systems reviewed and are negative.   Exam:   BP 128/78 (BP Location: Right Arm, Patient Position:  Sitting)   Pulse (!) 118   Resp 20   Ht 5' 2.4" (1.585 m)   Wt 265 lb (120.2 kg)   LMP 09/18/2021 (Approximate)   BMI 47.85 kg/m   Weight change: '@WEIGHTCHANGE'$ @ Height:   Height: 5' 2.4" (158.5 cm)  Ht Readings from Last 3 Encounters:  10/02/21 5' 2.4" (1.585 m)  05/09/21 5' 2.25" (1.581 m)  04/09/21 5' 1.5" (1.562 m)    General appearance: alert, cooperative and appears stated age Head: Normocephalic, without obvious abnormality, atraumatic Neck: no adenopathy, supple, symmetrical, trachea midline and thyroid normal to inspection and palpation Lungs: clear to auscultation bilaterally Cardiovascular: regular rate and rhythm Breasts: normal appearance, no masses or tenderness Abdomen: soft, non-tender; non distended,  no masses,  no organomegaly Extremities: extremities normal, atraumatic, no cyanosis or edema Skin: Skin color, texture,  turgor normal. No rashes or lesions Lymph nodes: Cervical, supraclavicular, and axillary nodes normal. No abnormal inguinal nodes palpated Neurologic: Grossly normal   Pelvic: External genitalia:  no lesions              Urethra:  normal appearing urethra with no masses, tenderness or lesions              Bartholins and Skenes: normal                 Vagina: normal appearing vagina with normal color and discharge, no lesions              Cervix: no lesions               Bimanual Exam:  Uterus:   anteverted, no masses or tenderness              Adnexa: no mass, fullness, tenderness               Rectovaginal: Confirms               Anus:  normal sphincter tone, no lesions  The risks of endometrial biopsy were reviewed and a consent was obtained.  A speculum was placed in the vagina and the cervix was cleansed with betadine. A tenaculum was placed on the cervix and the pipelle was placed into the endometrial cavity. The uterus sounded to 8 cm. The endometrial biopsy was performed, taking care to get a representative sample, sampling 360 degrees of the uterine cavity. Moderate tissue was obtained. The tenaculum and speculum were removed. There were no complications.    Glorianne Manchester, RN chaperoned for the exam.  1. Well woman exam Discussed breast self exam Discussed calcium and vit D intake Labs with primary  2. Screening for cervical cancer - Cytology - PAP  3. Menometrorrhagia Discussed treatment options, she will return for mirena IUD insertion - US PELVIS TRANSVAGINAL NON-OB (TV ONLY); Future - IUD Insertion; Future - Surgical pathology( [place]/ [facility])

## 2021-10-02 ENCOUNTER — Other Ambulatory Visit (HOSPITAL_COMMUNITY)
Admission: RE | Admit: 2021-10-02 | Discharge: 2021-10-02 | Disposition: A | Discharge status: Home / Self Care | Payer: BC Managed Care – PPO | Source: Ambulatory Visit | Attending: Obstetrics and Gynecology | Admitting: Obstetrics and Gynecology

## 2021-10-02 ENCOUNTER — Encounter: Payer: Self-pay | Admitting: Obstetrics and Gynecology

## 2021-10-02 ENCOUNTER — Ambulatory Visit (INDEPENDENT_AMBULATORY_CARE_PROVIDER_SITE_OTHER): Payer: BC Managed Care – PPO | Admitting: Obstetrics and Gynecology

## 2021-10-02 VITALS — BP 128/78 | HR 118 | Resp 20 | Ht 62.4 in | Wt 265.0 lb

## 2021-10-02 DIAGNOSIS — Z124 Encounter for screening for malignant neoplasm of cervix: Secondary | ICD-10-CM

## 2021-10-02 DIAGNOSIS — N921 Excessive and frequent menstruation with irregular cycle: Secondary | ICD-10-CM | POA: Diagnosis present

## 2021-10-02 DIAGNOSIS — Z01419 Encounter for gynecological examination (general) (routine) without abnormal findings: Secondary | ICD-10-CM | POA: Diagnosis not present

## 2021-10-02 NOTE — Patient Instructions (Signed)

## 2021-10-03 NOTE — Telephone Encounter (Signed)
Can you see if it is possible to change this patient's appointment for her and let her know. Thanks!

## 2021-10-06 LAB — SURGICAL PATHOLOGY

## 2021-10-06 NOTE — Telephone Encounter (Signed)
Per review of Epic, patient has been rescheduled to 10/16/21.   Encounter closed.

## 2021-10-07 LAB — CYTOLOGY - PAP
Comment: NEGATIVE
Diagnosis: NEGATIVE
High risk HPV: NEGATIVE

## 2021-10-13 ENCOUNTER — Other Ambulatory Visit: Payer: Self-pay | Admitting: *Deleted

## 2021-10-13 DIAGNOSIS — N921 Excessive and frequent menstruation with irregular cycle: Secondary | ICD-10-CM

## 2021-10-15 ENCOUNTER — Other Ambulatory Visit: Payer: BC Managed Care – PPO | Admitting: Obstetrics and Gynecology

## 2021-10-15 ENCOUNTER — Encounter (INDEPENDENT_AMBULATORY_CARE_PROVIDER_SITE_OTHER): Payer: Self-pay

## 2021-10-15 ENCOUNTER — Other Ambulatory Visit: Payer: Self-pay

## 2021-10-16 ENCOUNTER — Other Ambulatory Visit: Payer: BC Managed Care – PPO | Admitting: Obstetrics and Gynecology

## 2021-10-16 ENCOUNTER — Other Ambulatory Visit: Payer: BC Managed Care – PPO

## 2021-11-01 ENCOUNTER — Other Ambulatory Visit: Payer: Self-pay | Admitting: Medical

## 2021-11-03 ENCOUNTER — Other Ambulatory Visit (HOSPITAL_BASED_OUTPATIENT_CLINIC_OR_DEPARTMENT_OTHER): Payer: Self-pay

## 2021-11-03 MED ORDER — ESCITALOPRAM OXALATE 20 MG PO TABS
20.0000 mg | ORAL_TABLET | Freq: Every day | ORAL | 2 refills | Status: DC
  Filled 2021-11-03 – 2022-01-09 (×2): qty 30, 30d supply, fill #0
  Filled 2022-02-24: qty 30, 30d supply, fill #1
  Filled 2022-04-24: qty 30, 30d supply, fill #2

## 2021-11-03 NOTE — Telephone Encounter (Signed)
Refill on Lexapro last apt 05/09/21 next apt 05/11/22.

## 2021-11-12 ENCOUNTER — Encounter: Payer: Self-pay | Admitting: Internal Medicine

## 2021-11-12 ENCOUNTER — Other Ambulatory Visit (HOSPITAL_BASED_OUTPATIENT_CLINIC_OR_DEPARTMENT_OTHER): Payer: Self-pay

## 2021-11-13 ENCOUNTER — Other Ambulatory Visit: Payer: BC Managed Care – PPO

## 2021-11-13 ENCOUNTER — Encounter: Payer: Self-pay | Admitting: Obstetrics and Gynecology

## 2021-11-13 ENCOUNTER — Ambulatory Visit (INDEPENDENT_AMBULATORY_CARE_PROVIDER_SITE_OTHER): Payer: BC Managed Care – PPO | Admitting: Obstetrics and Gynecology

## 2021-11-13 ENCOUNTER — Other Ambulatory Visit (INDEPENDENT_AMBULATORY_CARE_PROVIDER_SITE_OTHER): Payer: BC Managed Care – PPO

## 2021-11-13 ENCOUNTER — Other Ambulatory Visit: Payer: BC Managed Care – PPO | Admitting: Obstetrics and Gynecology

## 2021-11-13 DIAGNOSIS — N921 Excessive and frequent menstruation with irregular cycle: Secondary | ICD-10-CM

## 2021-11-13 DIAGNOSIS — Z30430 Encounter for insertion of intrauterine contraceptive device: Secondary | ICD-10-CM

## 2021-11-13 NOTE — Progress Notes (Signed)
GYNECOLOGY  VISIT   HPI: 36 y.o.   Married White or Caucasian Not Hispanic or Latino  female   G0P0000 with No LMP recorded.   here for further evaluation of AUB and placement of a mirena IUD. Endometrial biopsy from 10/02/21 was benign.    GYNECOLOGIC HISTORY: No LMP recorded. Contraception:female partner Menopausal hormone therapy: none        OB History     Gravida  0   Para  0   Term  0   Preterm  0   AB  0   Living  0      SAB  0   IAB  0   Ectopic  0   Multiple  0   Live Births  0              Patient Active Problem List   Diagnosis Date Noted   BMI 45.0-49.9, adult ([place]) 04/09/2021   Attention and concentration deficit 04/09/2021   Iron deficiency anemia due to chronic blood loss 08/27/2020   Menorrhagia with irregular cycle 07/02/2020   Seasonal allergic rhinitis due to pollen 06/25/2020   Leukocytosis 05/22/2020   Vaccine counseling 04/03/2020   Family history of breast cancer 04/03/2020   Impaired fasting blood sugar 04/03/2020   Encounter for health maintenance examination in adult 04/03/2020   Mild intermittent asthma 04/03/2020   Generalized anxiety disorder 09/25/2019   Vitamin D deficiency 09/25/2019    Past Medical History:  Diagnosis Date   Anxiety    Asthma    Depression    Heartburn    Pre-diabetes    Reactive airway disease    Seasonal allergies    Vitamin D deficiency     Past Surgical History:  Procedure Laterality Date   WISDOM TOOTH EXTRACTION      Current Outpatient Medications  Medication Sig Dispense Refill   albuterol (VENTOLIN HFA) 108 (90 Base) MCG/ACT inhaler Inhale 2 puffs into the lungs every 6 (six) hours as needed for wheezing or shortness of breath. 18 g 1   cetirizine (ZYRTEC) 10 MG tablet Take 10 mg by mouth daily.     Cholecalciferol (VITAMIN D) 50 MCG (2000 UT) CAPS Take 1 capsule (2,000 Units total) by mouth daily. 90 capsule 1   escitalopram (LEXAPRO) 20 MG tablet Take 1 tablet (20 mg total) by  mouth daily. 30 tablet 2   ferrous gluconate (FERGON) 324 MG tablet Take 1 tablet (324 mg total) by mouth daily with breakfast. 60 tablet 2   No current facility-administered medications for this visit.     ALLERGIES: Pollen extract  Family History  Problem Relation Age of Onset   Depression Mother    Obesity Mother    Hypertension Father    Sleep apnea Father    Obesity Father    Cancer Father        skin, basal   Cancer Maternal Grandmother        breast   Diabetes Maternal Grandmother    Cancer Maternal Grandfather        prostate   Cancer Paternal Grandmother        breast, lymphoma, skin   Alzheimer's disease Paternal Grandfather    Diabetes Paternal Grandfather    Heart disease Neg Hx    Stroke Neg Hx     Social History   Socioeconomic History   Marital status: Married    Spouse name: Not on file   Number of children: Not on file   Years  of education: Not on file   Highest education level: Not on file  Occupational History   Not on file  Tobacco Use   Smoking status: Never   Smokeless tobacco: Never  Vaping Use   Vaping Use: Never used  Substance and Sexual Activity   Alcohol use: Yes    Comment: rare   Drug use: No   Sexual activity: Yes    Birth control/protection: None  Other Topics Concern   Not on file  Social History Narrative   Lives with wife and 2 children.  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  Exercise - chase her kids around, active on the job.     Social Determinants of Health   Financial Resource Strain: Not on file  Food Insecurity: Not on file  Transportation Needs: Not on file  Physical Activity: Not on file  Stress: Not on file  Social Connections: Not on file  Intimate Partner Violence: Not on file    ROS  PHYSICAL EXAMINATION:    There were no vitals taken for this visit.    General appearance: alert, cooperative and appears stated age  Pelvic: External genitalia:  no lesions              Urethra:  normal  appearing urethra with no masses, tenderness or lesions              Bartholins and Skenes: normal                 Vagina: normal appearing vagina with normal color and discharge, no lesions              Cervix: no lesions                See U/S report The IUD was inserted with ultrasound guidance  The risks of the mirena IUD were reviewed with the patient, including infection, abnormal bleeding and uterine perfortion. Consent was signed.  A speculum was placed in the vagina, the cervix was cleansed with betadine. A tenaculum was placed on the cervix, the uterus sounded to 7-8 cm. The cervix was dilated to a 5 hagar dilatro  The mirnea IUD was inserted without difficulty. The string were cut to 3 cm.    The patient tolerated the procedure well.   Chaperone was present for exam.  1. Menometrorrhagia Negative evaluation  2. Encounter for IUD insertion IUD inserted with u/s guidance, in place on U/S.  F/U in one month

## 2021-11-13 NOTE — Patient Instructions (Signed)

## 2021-12-15 ENCOUNTER — Encounter: Payer: Self-pay | Admitting: Obstetrics and Gynecology

## 2021-12-15 ENCOUNTER — Ambulatory Visit: Payer: BC Managed Care – PPO | Admitting: Obstetrics and Gynecology

## 2021-12-15 VITALS — BP 126/70 | HR 72 | Wt 263.0 lb

## 2021-12-15 DIAGNOSIS — Z30431 Encounter for routine checking of intrauterine contraceptive device: Secondary | ICD-10-CM

## 2021-12-15 NOTE — Progress Notes (Signed)
GYNECOLOGY  VISIT   HPI: 36 y.o.   Married White or Caucasian Not Hispanic or Latino  female   [id number] with No LMP recorded.   here for IUD follow up. She is still spotting daily. Occasional mild cramps.   GYNECOLOGIC HISTORY: No LMP recorded. Contraception:IUD  Menopausal hormone therapy: none         OB History     Gravida  0   Para  0   Term  0   Preterm  0   AB  0   Living  0      SAB  0   IAB  0   Ectopic  0   Multiple  0   Live Births  0              Patient Active Problem List   Diagnosis Date Noted   BMI 45.0-49.9, adult ([place]) 04/09/2021   Attention and concentration deficit 04/09/2021   Iron deficiency anemia due to chronic blood loss 08/27/2020   Menorrhagia with irregular cycle 07/02/2020   Seasonal allergic rhinitis due to pollen 06/25/2020   Leukocytosis 05/22/2020   Vaccine counseling 04/03/2020   Family history of breast cancer 04/03/2020   Impaired fasting blood sugar 04/03/2020   Encounter for health maintenance examination in adult 04/03/2020   Mild intermittent asthma 04/03/2020   Generalized anxiety disorder 09/25/2019   Vitamin D deficiency 09/25/2019    Past Medical History:  Diagnosis Date   Anxiety    Asthma    Depression    Heartburn    Pre-diabetes    Reactive airway disease    Seasonal allergies    Vitamin D deficiency     Past Surgical History:  Procedure Laterality Date   WISDOM TOOTH EXTRACTION      Current Outpatient Medications  Medication Sig Dispense Refill   albuterol (VENTOLIN HFA) 108 (90 Base) MCG/ACT inhaler Inhale 2 puffs into the lungs every 6 (six) hours as needed for wheezing or shortness of breath. 18 g 1   cetirizine (ZYRTEC) 10 MG tablet Take 10 mg by mouth daily.     Cholecalciferol (VITAMIN D) 50 MCG (2000 UT) CAPS Take 1 capsule (2,000 Units total) by mouth daily. 90 capsule 1   escitalopram (LEXAPRO) 20 MG tablet Take 1 tablet (20 mg total) by mouth daily. 30 tablet 2   ferrous  gluconate (FERGON) 324 MG tablet Take 1 tablet (324 mg total) by mouth daily with breakfast. 60 tablet 2   No current facility-administered medications for this visit.     ALLERGIES: Pollen extract  Family History  Problem Relation Age of Onset   Depression Mother    Obesity Mother    Hypertension Father    Sleep apnea Father    Obesity Father    Cancer Father        skin, basal   Cancer Maternal Grandmother        breast   Diabetes Maternal Grandmother    Cancer Maternal Grandfather        prostate   Cancer Paternal Grandmother        breast, lymphoma, skin   Alzheimer's disease Paternal Grandfather    Diabetes Paternal Grandfather    Heart disease Neg Hx    Stroke Neg Hx     Social History   Socioeconomic History   Marital status: Married    Spouse name: Not on file   Number of children: Not on file   Years of education: Not on file  Highest education level: Not on file  Occupational History   Not on file  Tobacco Use   Smoking status: Never   Smokeless tobacco: Never  Vaping Use   Vaping Use: Never used  Substance and Sexual Activity   Alcohol use: Yes    Comment: rare   Drug use: No   Sexual activity: Yes    Birth control/protection: None  Other Topics Concern   Not on file  Social History Narrative   Lives with wife and 2 children.  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  Exercise - chase her kids around, active on the job.     Social Determinants of Health   Financial Resource Strain: Not on file  Food Insecurity: Not on file  Transportation Needs: Not on file  Physical Activity: Not on file  Stress: Not on file  Social Connections: Not on file  Intimate Partner Violence: Not on file    Review of Systems  All other systems reviewed and are negative.   PHYSICAL EXAMINATION:    There were no vitals taken for this visit.    General appearance: alert, cooperative and appears stated age  Pelvic: External genitalia:  no lesions               Urethra:  normal appearing urethra with no masses, tenderness or lesions              Bartholins and Skenes: normal                 Vagina: normal appearing vagina with normal color and discharge, no lesions              Cervix: no lesions and IUD strings 2-3 cm              Bimanual Exam:  Uterus:  normal size, contour, position, consistency, mobility, non-tender              Adnexa: no mass, fullness, tenderness                Chaperone was present for exam.  1. IUD check up Doing well Routine f/u

## 2021-12-16 ENCOUNTER — Encounter: Payer: Self-pay | Admitting: Internal Medicine

## 2022-01-09 ENCOUNTER — Other Ambulatory Visit (HOSPITAL_BASED_OUTPATIENT_CLINIC_OR_DEPARTMENT_OTHER): Payer: Self-pay

## 2022-03-18 ENCOUNTER — Encounter: Payer: Self-pay | Admitting: Internal Medicine

## 2022-05-11 ENCOUNTER — Encounter: Payer: BC Managed Care – PPO | Admitting: Medical

## 2022-06-11 ENCOUNTER — Other Ambulatory Visit: Payer: Self-pay | Admitting: Medical

## 2022-06-12 ENCOUNTER — Encounter: Payer: Self-pay | Admitting: Obstetrics and Gynecology

## 2022-06-12 ENCOUNTER — Other Ambulatory Visit (HOSPITAL_BASED_OUTPATIENT_CLINIC_OR_DEPARTMENT_OTHER): Payer: Self-pay

## 2022-06-12 ENCOUNTER — Other Ambulatory Visit: Payer: Self-pay | Admitting: Medical

## 2022-06-12 MED ORDER — ESCITALOPRAM OXALATE 20 MG PO TABS
20.0000 mg | ORAL_TABLET | Freq: Every day | ORAL | 0 refills | Status: DC
  Filled 2022-06-12: qty 30, 30d supply, fill #0

## 2022-06-12 NOTE — Telephone Encounter (Signed)
Refill denied 

## 2022-06-12 NOTE — Telephone Encounter (Signed)
Patient cx 05/11/22 CPE, called and LM asking patient to call and r/s. Refill came over today.

## 2022-06-15 ENCOUNTER — Other Ambulatory Visit: Payer: Self-pay | Admitting: Medical

## 2022-06-15 ENCOUNTER — Telehealth: Payer: Self-pay | Admitting: Medical

## 2022-06-15 ENCOUNTER — Other Ambulatory Visit (HOSPITAL_BASED_OUTPATIENT_CLINIC_OR_DEPARTMENT_OTHER): Payer: Self-pay

## 2022-06-15 ENCOUNTER — Other Ambulatory Visit (HOSPITAL_COMMUNITY): Payer: Self-pay

## 2022-06-15 DIAGNOSIS — J4520 Mild intermittent asthma, uncomplicated: Secondary | ICD-10-CM

## 2022-06-15 MED ORDER — ESCITALOPRAM OXALATE 20 MG PO TABS
20.0000 mg | ORAL_TABLET | Freq: Every day | ORAL | 1 refills | Status: DC
  Filled 2022-06-15 – 2022-06-18 (×3): qty 30, 30d supply, fill #0
  Filled 2022-08-21: qty 30, 30d supply, fill #1

## 2022-06-15 MED ORDER — ALBUTEROL SULFATE HFA 108 (90 BASE) MCG/ACT IN AERS
2.0000 | INHALATION_SPRAY | Freq: Four times a day (QID) | RESPIRATORY_TRACT | 0 refills | Status: DC | PRN
  Filled 2022-06-15: qty 6.7, 25d supply, fill #0
  Filled 2022-06-18: qty 6.7, 10d supply, fill #0

## 2022-06-15 NOTE — Telephone Encounter (Signed)
Pt scheduled CPE for June, needs refill albuterol inhaler & Lexapro to [facility] pharmacy to hold until appt

## 2022-06-18 ENCOUNTER — Other Ambulatory Visit (HOSPITAL_COMMUNITY): Payer: Self-pay

## 2022-06-18 ENCOUNTER — Other Ambulatory Visit (HOSPITAL_BASED_OUTPATIENT_CLINIC_OR_DEPARTMENT_OTHER): Payer: Self-pay

## 2022-06-23 NOTE — Telephone Encounter (Signed)
done

## 2022-08-21 ENCOUNTER — Other Ambulatory Visit (HOSPITAL_BASED_OUTPATIENT_CLINIC_OR_DEPARTMENT_OTHER): Payer: Self-pay

## 2022-08-31 ENCOUNTER — Encounter: Payer: Self-pay | Admitting: Hematology and Oncology

## 2022-08-31 ENCOUNTER — Other Ambulatory Visit (HOSPITAL_COMMUNITY): Payer: Self-pay

## 2022-08-31 ENCOUNTER — Ambulatory Visit: Payer: BC Managed Care – PPO | Admitting: Medical

## 2022-08-31 ENCOUNTER — Encounter: Payer: Self-pay | Admitting: Medical

## 2022-08-31 VITALS — BP 132/76 | HR 97 | Temp 98.1°F | Resp 1 | Ht 62.0 in | Wt 276.0 lb

## 2022-08-31 DIAGNOSIS — J301 Allergic rhinitis due to pollen: Secondary | ICD-10-CM | POA: Diagnosis not present

## 2022-08-31 DIAGNOSIS — J4520 Mild intermittent asthma, uncomplicated: Secondary | ICD-10-CM

## 2022-08-31 DIAGNOSIS — Z975 Presence of (intrauterine) contraceptive device: Secondary | ICD-10-CM | POA: Insufficient documentation

## 2022-08-31 DIAGNOSIS — E559 Vitamin D deficiency, unspecified: Secondary | ICD-10-CM

## 2022-08-31 DIAGNOSIS — Z13220 Encounter for screening for lipoid disorders: Secondary | ICD-10-CM

## 2022-08-31 DIAGNOSIS — R7301 Impaired fasting glucose: Secondary | ICD-10-CM

## 2022-08-31 DIAGNOSIS — Z0000 Encounter for general adult medical examination without abnormal findings: Secondary | ICD-10-CM

## 2022-08-31 DIAGNOSIS — Z131 Encounter for screening for diabetes mellitus: Secondary | ICD-10-CM

## 2022-08-31 LAB — CBC WITH DIFFERENTIAL/PLATELET
Basophils Absolute: 0.1 10*3/uL (ref 0.0–0.2)
EOS (ABSOLUTE): 0.2 10*3/uL (ref 0.0–0.4)
Eos: 2 %
Hemoglobin: 12.4 g/dL (ref 11.1–15.9)
Immature Granulocytes: 0 %
Lymphs: 19 %
MCHC: 32.3 g/dL (ref 31.5–35.7)
MCV: 83 fL (ref 79–97)
Monocytes Absolute: 0.8 10*3/uL (ref 0.1–0.9)
Neutrophils: 71 %
Platelets: 424 10*3/uL (ref 150–450)

## 2022-08-31 LAB — POCT URINALYSIS DIP (CLINITEK)
Bilirubin, UA: NEGATIVE
Blood, UA: NEGATIVE
Glucose, UA: NEGATIVE mg/dL
Ketones, POC UA: NEGATIVE mg/dL
Nitrite, UA: NEGATIVE
POC PROTEIN,UA: NEGATIVE
Spec Grav, UA: 1.02 (ref 1.010–1.025)
Urobilinogen, UA: 0.2 E.U./dL
pH, UA: 6 (ref 5.0–8.0)

## 2022-08-31 LAB — VITAMIN D 25 HYDROXY (VIT D DEFICIENCY, FRACTURES)

## 2022-08-31 LAB — HEMOGLOBIN A1C

## 2022-08-31 MED ORDER — HYDROCORTISONE ACETATE 25 MG RE SUPP
25.0000 mg | Freq: Two times a day (BID) | RECTAL | 0 refills | Status: DC
  Filled 2022-08-31: qty 12, 6d supply, fill #0

## 2022-08-31 NOTE — Progress Notes (Signed)
Subjective:   HPI  Melissa Mason is a 37 y.o. female who presents for Chief Complaint  Patient presents with   Annual Exam    CPE. Fasting x 6 hours.     Patient Care Team: Bryce Cheever, Cleda Mccreedy as PCP - General (Family Medicine) Sees dentist Sees eye doctor Dr. Gertie Exon, gynecology Dr. Serena Croissant, hematology   Concerns: Lately feels she has no personal time, no time to take care of self.  They have 2 young children and her spouse has a baby on the way.   Spouse works 12 hour nights as traveling Engineer, civil (consulting) and Murdis has her own job.  Sometimes Amaka feels like a single mother given their current schedule and responsibilities.  Feels mental fog, lack of stress relief.  Denies depression, denies suicidal ideation.    She notes some times feels itching or irritation at anus, sometimes doesn't feel complete evacuation of stool.  Gyn placed IUD 12/2021 due to prior heavy periods and iron deficiency anemia  Reviewed their medical, surgical, family, social, medication, and allergy history and updated chart as appropriate.  Past Medical History:  Diagnosis Date   Anxiety    Asthma    Depression    Heartburn    Pre-diabetes    Reactive airway disease    Seasonal allergies    Vitamin D deficiency     Family History  Problem Relation Age of Onset   Depression Mother    Obesity Mother    Hypertension Father    Sleep apnea Father    Obesity Father    Cancer Father        skin, basal   Cancer Maternal Grandmother        breast   Diabetes Maternal Grandmother    Cancer Maternal Grandfather        prostate   Cancer Paternal Grandmother        breast, lymphoma, skin   Alzheimer's disease Paternal Grandfather    Diabetes Paternal Grandfather    Heart disease Neg Hx    Stroke Neg Hx      Current Outpatient Medications:    albuterol (VENTOLIN HFA) 108 (90 Base) MCG/ACT inhaler, Inhale 2 puffs into the lungs every 6 (six) hours as needed for wheezing or shortness of  breath., Disp: 6.7 g, Rfl: 0   cetirizine (ZYRTEC) 10 MG tablet, Take 10 mg by mouth daily., Disp: , Rfl:    Cholecalciferol (VITAMIN D) 50 MCG (2000 UT) CAPS, Take 1 capsule (2,000 Units total) by mouth daily., Disp: 90 capsule, Rfl: 1   escitalopram (LEXAPRO) 20 MG tablet, Take 1 tablet (20 mg total) by mouth daily., Disp: 30 tablet, Rfl: 1   hydrocortisone (ANUSOL-HC) 25 MG suppository, Place 1 suppository (25 mg total) rectally 2 (two) times daily., Disp: 12 suppository, Rfl: 0  Allergies  Allergen Reactions   Pollen Extract Other (See Comments)    Sneezing sneezing    Review of Systems  Constitutional:  Negative for chills, fever, malaise/fatigue and weight loss.  HENT:  Negative for congestion, ear pain, hearing loss, sore throat and tinnitus.   Eyes:  Negative for blurred vision, pain and redness.  Respiratory:  Negative for cough, hemoptysis and shortness of breath.   Cardiovascular:  Negative for chest pain, palpitations, orthopnea, claudication and leg swelling.  Gastrointestinal:  Negative for abdominal pain, blood in stool, constipation, diarrhea, nausea and vomiting.  Genitourinary:  Negative for dysuria, flank pain, frequency, hematuria and urgency.  Musculoskeletal:  Negative for  falls, joint pain and myalgias.  Skin:  Negative for itching and rash.  Neurological:  Negative for dizziness, tingling, speech change, weakness and headaches.  Endo/Heme/Allergies:  Negative for polydipsia. Does not bruise/bleed easily.  Psychiatric/Behavioral:  Negative for depression and memory loss. The patient is not nervous/anxious and does not have insomnia.        Stress, lack of focus, no free time         08/31/2022    2:16 PM 04/09/2021    2:39 PM 11/14/2020   11:26 AM 09/25/2019    3:21 PM 08/28/2019   10:29 AM  Depression screen PHQ 2/9  Decreased Interest 0 0 0 0 1  Down, Depressed, Hopeless 1 0 0 0 2  PHQ - 2 Score 1 0 0 0 3  Altered sleeping     2  Tired, decreased energy      3  Change in appetite     2  Feeling bad or failure about yourself      3  Trouble concentrating     3  Moving slowly or fidgety/restless     0  Suicidal thoughts     0  PHQ-9 Score     16  Difficult doing work/chores     Not difficult at all        Objective:  BP 132/76   Pulse 97   Temp 98.1 F (36.7 C) (Oral)   Resp (!) 1   Ht 5\' 2"  (1.575 m)   Wt 276 lb (125.2 kg)   SpO2 96% Comment: room air  BMI 50.48 kg/m   BP Readings from Last 3 Encounters:  08/31/22 132/76  12/15/21 126/70  10/02/21 128/78   Wt Readings from Last 3 Encounters:  08/31/22 276 lb (125.2 kg)  12/15/21 263 lb (119.3 kg)  10/02/21 265 lb (120.2 kg)   General appearance: alert, no distress, WD/WN, Caucasian female Skin: unremarkable HEENT: normocephalic, conjunctiva/corneas normal, sclerae anicteric, PERRLA, EOMi, Neck: supple, no lymphadenopathy, no thyromegaly, no masses, normal ROM, no bruits Chest: non tender, normal shape and expansion Heart: RRR, normal S1, S2, no murmurs Lungs: CTA bilaterally, no wheezes, rhonchi, or rales Abdomen: +bs, soft, non tender, non distended, no masses, no hepatomegaly, no splenomegaly, no bruits Back: non tender, normal ROM, no scoliosis Musculoskeletal: upper extremities non tender, no obvious deformity, normal ROM throughout, lower extremities non tender, no obvious deformity, normal ROM throughout Extremities: no edema, no cyanosis, no clubbing Pulses: 2+ symmetric, upper and lower extremities, normal cap refill Neurological: alert, oriented x 3, CN2-12 intact, strength normal upper extremities and lower extremities, sensation normal throughout, DTRs 2+ throughout, no cerebellar signs, gait normal Psychiatric: normal affect, behavior normal, pleasant  Breast/gyn/rectal - deferred to gynecology     Assessment and Plan :   Encounter Diagnoses  Name Primary?   Encounter for health maintenance examination in adult Yes   Mild intermittent asthma,  unspecified whether complicated    Seasonal allergic rhinitis due to pollen    Impaired fasting blood sugar    Vitamin D deficiency    Screening for diabetes mellitus    Screening for lipid disorders    IUD (intrauterine device) in place      This visit was a preventative care visit, also known as wellness visit or routine physical.   Topics typically include healthy lifestyle, diet, exercise, preventative care, vaccinations, sick and well care, proper use of emergency dept and after hours care, as well as other concerns.  Recommendations: Continue to return yearly for your annual wellness and preventative care visits.  This gives [place] a chance to discuss healthy lifestyle, exercise, vaccinations, review your chart record, and perform screenings where appropriate.  I recommend you see your eye doctor yearly for routine vision care.  I recommend you see your dentist yearly for routine dental care including hygiene visits twice yearly.  See your gynecologist yearly for routine gynecological care.    Vaccination recommendations were reviewed Immunization History  Administered Date(s) Administered   Hepatitis B 12/06/1995, 01/10/1996, 06/12/1996, 03/18/2015   Influenza Split 07/19/2012, 12/06/2020, 12/04/2021   Influenza,inj,quad, With Preservative 11/26/2017   Influenza-Unspecified 07/19/2012, 12/23/2014, 12/02/2015, 11/30/2016, 11/26/2017, 12/02/2018, 12/01/2019   MMR 03/28/1986, 01/21/1993   PFIZER(Purple Top)SARS-COV-2 Vaccination 03/01/2019, 03/20/2019, 12/15/2019   Pneumococcal Polysaccharide-23 05/09/2021   Tdap 05/12/2010, 11/07/2020   Vaxelis (DTaP,IPV,Hib,HepB) 10/19/2007     Screening for cancer: Colon cancer screening: Age 75  Breast cancer screening: You should perform a self breast exam monthly.   We reviewed recommendations for regular mammograms and breast cancer screening.  Cervical cancer screening: We reviewed recommendations for pap smear screening.    Skin cancer screening: Check your skin regularly for new changes, growing lesions, or other lesions of concern Come in for evaluation if you have skin lesions of concern.  Lung cancer screening: If you have a greater than 20 pack year history of tobacco use, then you may qualify for lung cancer screening with a chest CT scan.   Please call your insurance company to inquire about coverage for this test.  We currently don't have screenings for other cancers besides breast, cervical, colon, and lung cancers.  If you have a strong family history of cancer or have other cancer screening concerns, please let me know.    Bone health: Get at least 150 minutes of aerobic exercise weekly Get weight bearing exercise at least once weekly Bone density test:  A bone density test is an imaging test that uses a type of X-ray to measure the amount of calcium and other minerals in your bones. The test may be used to diagnose or screen you for a condition that causes weak or thin bones (osteoporosis), predict your risk for a broken bone (fracture), or determine how well your osteoporosis treatment is working. The bone density test is recommended for females 65 and older, or females or males <65 if certain risk factors such as thyroid disease, long term use of steroids such as for asthma or rheumatological issues, vitamin D deficiency, estrogen deficiency, family history of osteoporosis, self or family history of fragility fracture in first degree relative.    Heart health: Get at least 150 minutes of aerobic exercise weekly Limit alcohol It is important to maintain a healthy blood pressure and healthy cholesterol numbers  Heart disease screening: Screening for heart disease includes screening for blood pressure, fasting lipids, glucose/diabetes screening, BMI height to weight ratio, reviewed of smoking status, physical activity, and diet.    Goals include blood pressure 120/80 or less, maintaining a  healthy lipid/cholesterol profile, preventing diabetes or keeping diabetes numbers under good control, not smoking or using tobacco products, exercising most days per week or at least 150 minutes per week of exercise, and eating healthy variety of fruits and vegetables, healthy oils, and avoiding unhealthy food choices like fried food, fast food, high sugar and high cholesterol foods.    Other tests may possibly include EKG test, CT coronary calcium score, echocardiogram, exercise treadmill stress test.  Medical care options: I recommend you continue to seek care here first for routine care.  We try really hard to have available appointments Monday through Friday daytime hours for sick visits, acute visits, and physicals.  Urgent care should be used for after hours and weekends for significant issues that cannot wait till the next day.  The emergency department should be used for significant potentially life-threatening emergencies.  The emergency department is expensive, can often have long wait times for less significant concerns, so try to utilize primary care, urgent care, or telemedicine when possible to avoid unnecessary trips to the emergency department.  Virtual visits and telemedicine have been introduced since the pandemic started in 2020, and can be convenient ways to receive medical care.  We offer virtual appointments as well to assist you in a variety of options to seek medical care.    Separate significant issues discussed: Asthma-mild intermittent, continue albuterol as needed.    Vitamin D deficiency-labs today, continue supplement  Obesity-continue efforts to lose weight through healthy diet and exercise.    Focus and stress concerns - adivsed she do some counseling through employee assistance program,counseled on journaling, finding time in schedule for personal time, exercise, stress reduction  Rectal fullness, possible some mild constipation - counseling on water and fiber  intake, consider miralax daily for 1-2 weeks as a trial, consider proctosol rectal suppository if no improve after miralax in the next 2 weeks  [name] was seen today for annual exam.  Diagnoses and all orders for this visit:  Encounter for health maintenance examination in adult -     POCT URINALYSIS DIP (CLINITEK) -     Comprehensive metabolic panel -     CBC with Differential/Platelet -     VITAMIN D 25 Hydroxy (Vit-D Deficiency, Fractures) -     Hemoglobin A1c -     TSH -     Lipid panel  Mild intermittent asthma, unspecified whether complicated  Seasonal allergic rhinitis due to pollen  Impaired fasting blood sugar -     Hemoglobin A1c  Vitamin D deficiency -     VITAMIN D 25 Hydroxy (Vit-D Deficiency, Fractures)  Screening for diabetes mellitus -     Hemoglobin A1c  Screening for lipid disorders -     Lipid panel  IUD (intrauterine device) in place  Other orders -     hydrocortisone (ANUSOL-HC) 25 MG suppository; Place 1 suppository (25 mg total) rectally 2 (two) times daily.   Follow-up pending labs, yearly for physical

## 2022-09-01 ENCOUNTER — Other Ambulatory Visit (HOSPITAL_COMMUNITY): Payer: Self-pay

## 2022-09-01 ENCOUNTER — Other Ambulatory Visit: Payer: Self-pay | Admitting: Medical

## 2022-09-01 LAB — CBC WITH DIFFERENTIAL/PLATELET
Basos: 1 %
Hematocrit: 38.4 % (ref 34.0–46.6)
Immature Grans (Abs): 0 10*3/uL (ref 0.0–0.1)
Lymphocytes Absolute: 2.1 10*3/uL (ref 0.7–3.1)
MCH: 26.7 pg (ref 26.6–33.0)
Monocytes: 7 %
Neutrophils Absolute: 8.1 10*3/uL — ABNORMAL HIGH (ref 1.4–7.0)
RBC: 4.64 x10E6/uL (ref 3.77–5.28)
RDW: 13.1 % (ref 11.7–15.4)
WBC: 11.2 10*3/uL — ABNORMAL HIGH (ref 3.4–10.8)

## 2022-09-01 LAB — LIPID PANEL
Chol/HDL Ratio: 4.1 ratio (ref 0.0–4.4)
Cholesterol, Total: 191 mg/dL (ref 100–199)
HDL: 47 mg/dL (ref >39)
LDL Chol Calc (NIH): 131 mg/dL — ABNORMAL HIGH (ref 0–99)
Triglycerides: 70 mg/dL (ref 0–149)
VLDL Cholesterol Cal: 13 mg/dL (ref 5–40)

## 2022-09-01 LAB — COMPREHENSIVE METABOLIC PANEL
ALT: 19 IU/L (ref 0–32)
AST: 16 IU/L (ref 0–40)
Albumin: 4.5 g/dL (ref 3.9–4.9)
Alkaline Phosphatase: 92 IU/L (ref 44–121)
BUN/Creatinine Ratio: 19 (ref 9–23)
BUN: 13 mg/dL (ref 6–20)
Bilirubin Total: 0.4 mg/dL (ref 0.0–1.2)
CO2: 22 mmol/L (ref 20–29)
Calcium: 9.8 mg/dL (ref 8.7–10.2)
Chloride: 99 mmol/L (ref 96–106)
Creatinine, Ser: 0.7 mg/dL (ref 0.57–1.00)
Globulin, Total: 3.2 g/dL (ref 1.5–4.5)
Glucose: 83 mg/dL (ref 70–99)
Potassium: 4.6 mmol/L (ref 3.5–5.2)
Sodium: 137 mmol/L (ref 134–144)
Total Protein: 7.7 g/dL (ref 6.0–8.5)
eGFR: 113 mL/min/{1.73_m2} (ref >59)

## 2022-09-01 LAB — HEMOGLOBIN A1C: Est. average glucose Bld gHb Est-mCnc: 126 mg/dL

## 2022-09-01 LAB — TSH: TSH: 2.35 u[IU]/mL (ref 0.450–4.500)

## 2022-09-01 MED ORDER — VITAMIN D (ERGOCALCIFEROL) 1.25 MG (50000 UNIT) PO CAPS
50000.0000 [IU] | ORAL_CAPSULE | ORAL | 3 refills | Status: DC
  Filled 2022-09-01 – 2022-09-30 (×2): qty 12, 84d supply, fill #0
  Filled 2022-12-19: qty 12, 84d supply, fill #1
  Filled 2023-03-23: qty 12, 84d supply, fill #2
  Filled 2023-08-26: qty 12, 84d supply, fill #3

## 2022-09-01 NOTE — Progress Notes (Signed)
Results sent through MyChart

## 2022-09-14 ENCOUNTER — Other Ambulatory Visit (HOSPITAL_COMMUNITY): Payer: Self-pay

## 2022-09-25 ENCOUNTER — Telehealth: Payer: Self-pay

## 2022-09-25 NOTE — Telephone Encounter (Signed)
Pt. Called stating her wife is having a baby in October. Her obgyn wants her to get an updated T-dap even though she is up to date on her T-dap had one 2022.

## 2022-09-30 ENCOUNTER — Other Ambulatory Visit (HOSPITAL_COMMUNITY): Payer: Self-pay

## 2022-09-30 ENCOUNTER — Other Ambulatory Visit: Payer: Self-pay | Admitting: Medical

## 2022-09-30 MED ORDER — ESCITALOPRAM OXALATE 20 MG PO TABS
20.0000 mg | ORAL_TABLET | Freq: Every day | ORAL | 1 refills | Status: DC
  Filled 2022-09-30: qty 90, 90d supply, fill #0
  Filled 2022-12-19: qty 90, 90d supply, fill #1

## 2022-10-01 ENCOUNTER — Other Ambulatory Visit (HOSPITAL_COMMUNITY): Payer: Self-pay

## 2022-10-06 ENCOUNTER — Encounter: Payer: Self-pay | Admitting: Nurse Practitioner

## 2022-10-06 ENCOUNTER — Ambulatory Visit (INDEPENDENT_AMBULATORY_CARE_PROVIDER_SITE_OTHER): Payer: BC Managed Care – PPO | Admitting: Nurse Practitioner

## 2022-10-06 VITALS — BP 126/74 | HR 90 | Ht 62.0 in | Wt 278.0 lb

## 2022-10-06 DIAGNOSIS — N921 Excessive and frequent menstruation with irregular cycle: Secondary | ICD-10-CM

## 2022-10-06 DIAGNOSIS — Z30431 Encounter for routine checking of intrauterine contraceptive device: Secondary | ICD-10-CM

## 2022-10-06 DIAGNOSIS — Z01419 Encounter for gynecological examination (general) (routine) without abnormal findings: Secondary | ICD-10-CM

## 2022-10-06 NOTE — Progress Notes (Signed)
   SHELIKA SPAFFORD 06-29-1985 [id number]   History:  37 y.o. G0 presents for annual exam. Mirena IUD 11/2021 for management of irregular, heavy periods and anemia. Hgb 12 last month. Good management, has occasional spotting. H/O PCOS. Has had iron transfusions in the past. Negative EMB 09/2021. Normal pap history. Vit D deficiency, pre-diabetes managed by PCP.   Gynecologic History No LMP recorded. (Menstrual status: IUD).   Contraception/Family planning: IUD, female partner Sexually active: Yes  Health Maintenance Last Pap: 10/02/2021. Results were: Normal neg HPV, 5-year repeat Last mammogram: Not indicated  Last colonoscopy: Not indicated  Last Dexa: Not indicated    Past medical history, past surgical history, family history and social history were all reviewed and documented in the EPIC chart. Married. 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 Wife is travel Engineer, civil (consulting). 38 yo son, 46 yo daughter, daughter due in October.   ROS:  A ROS was performed and pertinent positives and negatives are included.  Exam:  Vitals:   10/06/22 0854  BP: 126/74  Pulse: 90  SpO2: 98%  Weight: 278 lb (126.1 kg)  Height: 5\' 2"  (1.575 m)   Body mass index is 50.85 kg/m.  General appearance:  Normal Thyroid:  Symmetrical, normal in size, without palpable masses or nodularity. Respiratory  Auscultation:  Clear without wheezing or rhonchi Cardiovascular  Auscultation:  Regular rate, without rubs, murmurs or gallops  Edema/varicosities:  Not grossly evident Abdominal  Soft,nontender, without masses, guarding or rebound.  Liver/spleen:  No organomegaly noted  Hernia:  None appreciated  Skin  Inspection:  Grossly normal Breasts: Examined lying and sitting.   Right: Without masses, retractions, nipple discharge or axillary adenopathy.   Left: Without masses, retractions, nipple discharge or axillary adenopathy. Genitourinary   Inguinal/mons:  Normal without inguinal adenopathy  External  genitalia:  Normal appearing vulva with no masses, tenderness, or lesions  BUS/Urethra/Skene's glands:  Normal  Vagina:  Normal appearing with normal color and discharge, no lesions  Cervix:  Normal appearing without discharge or lesions. IUD strings visible  Uterus:  Difficult to palpate due to body habitus but no gross masses or tenderness  Adnexa/parametria:     Rt: Normal in size, without masses or tenderness.   Lt: Normal in size, without masses or tenderness.  Anus and perineum: Normal  Digital rectal exam: Deferred  Patient informed chaperone available to be present for breast and pelvic exam. Patient has requested no chaperone to be present. Patient has been advised what will be completed during breast and pelvic exam.   Assessment/Plan:  37 y.o. G0 for annual exam.   Well female exam with routine gynecological exam - Education provided on SBEs, importance of preventative screenings, current guidelines, high calcium diet, regular exercise, and multivitamin daily.  Labs with PCP.   Menometrorrhagia - Good management with IUD.   Encounter for routine checking of intrauterine contraceptive device (IUD) - Mirena IUD 11/2021. Aware of 8-year FDA approval. Normal exam.   Screening for cervical cancer - Normal Pap history.  Will repeat at 5-year interval per guidelines.  Return in 1 year for annual or sooner if needed.   Olivia Mackie DNP, 9:16 AM 10/06/2022

## 2023-03-08 ENCOUNTER — Encounter: Payer: Self-pay | Admitting: Hematology and Oncology

## 2023-03-23 ENCOUNTER — Other Ambulatory Visit (HOSPITAL_BASED_OUTPATIENT_CLINIC_OR_DEPARTMENT_OTHER): Payer: Self-pay

## 2023-03-23 ENCOUNTER — Other Ambulatory Visit: Payer: Self-pay

## 2023-03-23 ENCOUNTER — Other Ambulatory Visit: Payer: Self-pay | Admitting: Medical

## 2023-03-23 MED ORDER — ESCITALOPRAM OXALATE 20 MG PO TABS
20.0000 mg | ORAL_TABLET | Freq: Every day | ORAL | 1 refills | Status: DC
  Filled 2023-03-23: qty 90, 90d supply, fill #0
  Filled 2023-08-26 – 2023-09-24 (×2): qty 90, 90d supply, fill #1

## 2023-03-26 ENCOUNTER — Other Ambulatory Visit (HOSPITAL_BASED_OUTPATIENT_CLINIC_OR_DEPARTMENT_OTHER): Payer: Self-pay

## 2023-04-03 ENCOUNTER — Encounter: Payer: Self-pay | Admitting: Hematology and Oncology

## 2023-04-03 ENCOUNTER — Other Ambulatory Visit: Payer: Self-pay

## 2023-04-03 ENCOUNTER — Ambulatory Visit
Admission: EM | Admit: 2023-04-03 | Discharge: 2023-04-03 | Disposition: A | Discharge status: Home / Self Care | Payer: 59 | Attending: Family Medicine | Admitting: Family Medicine

## 2023-04-03 DIAGNOSIS — R6889 Other general symptoms and signs: Secondary | ICD-10-CM | POA: Diagnosis not present

## 2023-04-03 DIAGNOSIS — R062 Wheezing: Secondary | ICD-10-CM

## 2023-04-03 DIAGNOSIS — J4521 Mild intermittent asthma with (acute) exacerbation: Secondary | ICD-10-CM

## 2023-04-03 DIAGNOSIS — J101 Influenza due to other identified influenza virus with other respiratory manifestations: Secondary | ICD-10-CM | POA: Diagnosis not present

## 2023-04-03 LAB — POC COVID19/FLU A&B COMBO
Covid Antigen, POC: NEGATIVE
Influenza A Antigen, POC: POSITIVE — AB
Influenza B Antigen, POC: NEGATIVE

## 2023-04-03 MED ORDER — FLUTICASONE PROPIONATE HFA 110 MCG/ACT IN AERO: 1.0000 | INHALATION_SPRAY | Freq: Two times a day (BID) | RESPIRATORY_TRACT | 0 refills | Status: DC

## 2023-04-03 MED ORDER — IPRATROPIUM-ALBUTEROL 0.5-2.5 (3) MG/3ML IN SOLN
3.0000 mL | Freq: Once | RESPIRATORY_TRACT | Status: AC
  Administered 2023-04-03: 3 mL via RESPIRATORY_TRACT

## 2023-04-03 MED ORDER — BENZONATATE 200 MG PO CAPS
200.0000 mg | ORAL_CAPSULE | Freq: Three times a day (TID) | ORAL | 0 refills | Status: DC | PRN
  Filled 2023-04-03: qty 20, 7d supply, fill #0

## 2023-04-03 MED ORDER — ALBUTEROL SULFATE HFA 108 (90 BASE) MCG/ACT IN AERS: 1.0000 | INHALATION_SPRAY | Freq: Four times a day (QID) | RESPIRATORY_TRACT | 0 refills | Status: DC | PRN

## 2023-04-03 MED ORDER — OSELTAMIVIR PHOSPHATE 75 MG PO CAPS
75.0000 mg | ORAL_CAPSULE | Freq: Two times a day (BID) | ORAL | 0 refills | Status: DC
  Filled 2023-04-03: qty 10, 5d supply, fill #0

## 2023-04-03 MED ORDER — ALBUTEROL SULFATE HFA 108 (90 BASE) MCG/ACT IN AERS
1.0000 | INHALATION_SPRAY | Freq: Four times a day (QID) | RESPIRATORY_TRACT | 0 refills | Status: DC | PRN
  Filled 2023-04-03: qty 1, fill #0

## 2023-04-03 MED ORDER — BENZONATATE 200 MG PO CAPS: 200.0000 mg | ORAL_CAPSULE | Freq: Three times a day (TID) | ORAL | 0 refills | Status: DC | PRN

## 2023-04-03 MED ORDER — OSELTAMIVIR PHOSPHATE 75 MG PO CAPS: 75.0000 mg | ORAL_CAPSULE | Freq: Two times a day (BID) | ORAL | 0 refills | Status: DC

## 2023-04-03 MED ORDER — FLUTICASONE PROPIONATE HFA 110 MCG/ACT IN AERO
1.0000 | INHALATION_SPRAY | Freq: Two times a day (BID) | RESPIRATORY_TRACT | 0 refills | Status: DC
  Filled 2023-04-03: qty 1, 7d supply, fill #0

## 2023-04-03 NOTE — Discharge Instructions (Signed)
You have tested positive for influenza A.  Start Tamiflu twice daily for 5 days.  May take Tessalon as needed for cough.  I refilled your albuterol inhaler as needed for shortness of breath or wheezing.  Also start Flovent which is a steroid inhaler twice daily while you have your current symptoms.  Lots of rest and fluids.  Please follow-up with your PCP if your symptoms are not improving.  Please go to the ER for any worsening symptoms.  I hope you feel better soon!

## 2023-04-03 NOTE — ED Triage Notes (Signed)
Patient C/O productive cough, body aches, nasal congestion. Patient denies fever. Patient is taking mucinex D with some relief.

## 2023-04-03 NOTE — ED Provider Notes (Signed)
[facility]    CSN: [id number] Arrival date & time: 04/03/23  1254      History   Chief Complaint Chief Complaint  Patient presents with   Cough    HPI Melissa Mason is a 38 y.o. female  presents for evaluation of URI symptoms for 1 days. Patient reports associated symptoms of cough congestion, body aches, wheezing, fever. Denies N/V/D, sore throat, ear pain, shortness of breath. Patient does have a hx of asthma.  Has an albuterol inhaler which she has been using with temporary improvement.  Patient is not an active smoker.  All the children in her household have similar symptoms.  Pt has taken Mucinex OTC for symptoms. Pt has no other concerns at this time.    Cough Associated symptoms: myalgias and wheezing     Past Medical History:  Diagnosis Date   Anxiety    Asthma    Depression    Heartburn    Pre-diabetes    Reactive airway disease    Seasonal allergies    Vitamin D deficiency     Patient Active Problem List   Diagnosis Date Noted   IUD (intrauterine device) in place 08/31/2022   BMI 45.0-49.9, adult (HCC) 04/09/2021   Attention and concentration deficit 04/09/2021   Iron deficiency anemia due to chronic blood loss 08/27/2020   Menorrhagia with irregular cycle 07/02/2020   Seasonal allergic rhinitis due to pollen 06/25/2020   Leukocytosis 05/22/2020   Vaccine counseling 04/03/2020   Family history of breast cancer 04/03/2020   Impaired fasting blood sugar 04/03/2020   Encounter for health maintenance examination in adult 04/03/2020   Mild intermittent asthma 04/03/2020   Generalized anxiety disorder 09/25/2019   Vitamin D deficiency 09/25/2019    Past Surgical History:  Procedure Laterality Date   WISDOM TOOTH EXTRACTION      OB History     Gravida  0   Para  0   Term  0   Preterm  0   AB  0   Living  0      SAB  0   IAB  0   Ectopic  0   Multiple  0   Live Births  0            Home Medications    Prior to  Admission medications   Medication Sig Start Date End Date Taking? Authorizing Provider  albuterol (VENTOLIN HFA) 108 (90 Base) MCG/ACT inhaler Inhale 1-2 puffs into the lungs every 6 (six) hours as needed for wheezing or shortness of breath. 04/03/23  Yes Radford Pax, NP  benzonatate (TESSALON) 200 MG capsule Take 1 capsule (200 mg total) by mouth 3 (three) times daily as needed. 04/03/23  Yes Radford Pax, NP  fluticasone (FLOVENT HFA) 110 MCG/ACT inhaler Inhale 1 puff into the lungs 2 (two) times daily for 7 days. 04/03/23 04/10/23 Yes Radford Pax, NP  oseltamivir (TAMIFLU) 75 MG capsule Take 1 capsule (75 mg total) by mouth every 12 (twelve) hours. 04/03/23  Yes Radford Pax, NP  cetirizine (ZYRTEC) 10 MG tablet Take 10 mg by mouth daily.    [provider]  escitalopram (LEXAPRO) 20 MG tablet Take 1 tablet (20 mg total) by mouth daily. 03/23/23   Tysinger, Kermit Balo, PA-C  Vitamin D, Ergocalciferol, (DRISDOL) 1.25 MG (50000 UNIT) CAPS capsule Take 1 capsule (50,000 Units total) by mouth every 7 (seven) days. 09/01/22   Tysinger, Kermit Balo, PA-C    Family  History Family History  Problem Relation Age of Onset   Depression Mother    Obesity Mother    Hypertension Father    Sleep apnea Father    Obesity Father    Cancer Father        skin, basal   Cancer Maternal Grandmother        breast   Diabetes Maternal Grandmother    Cancer Maternal Grandfather        prostate   Cancer Paternal Grandmother        breast, lymphoma, skin   Alzheimer's disease Paternal Grandfather    Diabetes Paternal Grandfather    Heart disease Neg Hx    Stroke Neg Hx     Social History Social History   Tobacco Use   Smoking status: Never   Smokeless tobacco: Never  Vaping Use   Vaping status: Never Used  Substance Use Topics   Alcohol use: Not Currently    Comment: rare   Drug use: No     Allergies   Pollen extract   Review of Systems Review of Systems  HENT:  Positive for congestion.    Respiratory:  Positive for cough and wheezing.   Musculoskeletal:  Positive for myalgias.     Physical Exam Triage Vital Signs ED Triage Vitals [04/03/23 1350]  Encounter Vitals Group     BP 137/84     Systolic BP Percentile      Diastolic BP Percentile      Pulse Rate (!) 103     Resp 18     Temp 98.4 F (36.9 C)     Temp Source Oral     SpO2 96 %     Weight      Height      Head Circumference      Peak Flow      Pain Score 2     Pain Loc      Pain Education      Exclude from Growth Chart    No data found.  Updated Vital Signs BP 137/84 (BP Location: Left Arm)   Pulse (!) 103   Temp 98.4 F (36.9 C) (Oral)   Resp 18   SpO2 96%   Visual Acuity Right Eye Distance:   Left Eye Distance:   Bilateral Distance:    Right Eye Near:   Left Eye Near:    Bilateral Near:     Physical Exam Vitals and nursing note reviewed.  Constitutional:      General: She is not in acute distress.    Appearance: She is well-developed. She is not ill-appearing.  HENT:     Head: Normocephalic and atraumatic.     Right Ear: Tympanic membrane and ear canal normal.     Left Ear: Tympanic membrane and ear canal normal.     Nose: Congestion present.     Mouth/Throat:     Mouth: Mucous membranes are moist.     Pharynx: Oropharynx is clear. Uvula midline. No oropharyngeal exudate or posterior oropharyngeal erythema.     Tonsils: No tonsillar exudate or tonsillar abscesses.  Eyes:     Conjunctiva/sclera: Conjunctivae normal.     Pupils: Pupils are equal, round, and reactive to light.  Cardiovascular:     Rate and Rhythm: Regular rhythm. Tachycardia present.     Heart sounds: Normal heart sounds.     Comments: Mildly tacky at 103 Pulmonary:     Effort: Pulmonary effort is normal.     Breath sounds:  Wheezing present.     Comments: Mild expiratory wheezing bilateral lower bases Musculoskeletal:     Cervical back: Normal range of motion and neck supple.  Lymphadenopathy:      Cervical: No cervical adenopathy.  Skin:    General: Skin is warm and dry.  Neurological:     General: No focal deficit present.     Mental Status: She is alert and oriented to person, place, and time.  Psychiatric:        Mood and Affect: Mood normal.        Behavior: Behavior normal.      UC Treatments / Results  Labs (all labs ordered are listed, but only abnormal results are displayed) Labs Reviewed  POC COVID19/FLU A&B COMBO - Abnormal; Notable for the following components:      Result Value   Influenza A Antigen, POC Positive (*)    All other components within normal limits    EKG   Radiology No results found.  Procedures Procedures (including critical care time)  Medications Ordered in UC Medications  ipratropium-albuterol (DUONEB) 0.5-2.5 (3) MG/3ML nebulizer solution 3 mL (3 mLs Nebulization Given 04/03/23 1424)    Initial Impression / Assessment and Plan / UC Course  I have reviewed the triage vital signs and the nursing notes.  Pertinent labs & imaging results that were available during my care of the patient were reviewed by me and considered in my medical decision making (see chart for details).     Patient given DuoNeb in clinic for wheezing with resolution of symptoms.  Positive influenza A.  Will start Tamiflu.  Tessalon as needed for cough.  Refilled albuterol inhaler and will start Flovent steroid inhaler to use with current symptoms as she does not tolerate oral prednisone.  Advised to follow-up with PCP if symptoms do not improve.  Strict ER precautions reviewed and patient verbalized understanding. Final Clinical Impressions(s) / UC Diagnoses   Final diagnoses:  Wheezing  Flu-like symptoms  Influenza A  Mild intermittent asthma with acute exacerbation     Discharge Instructions      You have tested positive for influenza A.  Start Tamiflu twice daily for 5 days.  May take Tessalon as needed for cough.  I refilled your albuterol inhaler as  needed for shortness of breath or wheezing.  Also start Flovent which is a steroid inhaler twice daily while you have your current symptoms.  Lots of rest and fluids.  Please follow-up with your PCP if your symptoms are not improving.  Please go to the ER for any worsening symptoms.  I hope you feel better soon!     ED Prescriptions     Medication Sig Dispense Auth. Provider   oseltamivir (TAMIFLU) 75 MG capsule Take 1 capsule (75 mg total) by mouth every 12 (twelve) hours. 10 capsule Radford Pax, NP   albuterol (VENTOLIN HFA) 108 (90 Base) MCG/ACT inhaler Inhale 1-2 puffs into the lungs every 6 (six) hours as needed for wheezing or shortness of breath. 1 each Radford Pax, NP   benzonatate (TESSALON) 200 MG capsule Take 1 capsule (200 mg total) by mouth 3 (three) times daily as needed. 20 capsule Radford Pax, NP   fluticasone (FLOVENT HFA) 110 MCG/ACT inhaler Inhale 1 puff into the lungs 2 (two) times daily for 7 days. 1 each Radford Pax, NP      PDMP not reviewed this encounter.   Radford Pax, NP 04/03/23 [phone]

## 2023-04-05 ENCOUNTER — Other Ambulatory Visit (HOSPITAL_COMMUNITY): Payer: Self-pay

## 2023-04-07 ENCOUNTER — Other Ambulatory Visit (HOSPITAL_BASED_OUTPATIENT_CLINIC_OR_DEPARTMENT_OTHER): Payer: Self-pay

## 2023-04-07 ENCOUNTER — Encounter: Payer: Self-pay | Admitting: Hematology and Oncology

## 2023-04-07 MED ORDER — FLUTICASONE PROPIONATE HFA 110 MCG/ACT IN AERO
1.0000 | INHALATION_SPRAY | Freq: Two times a day (BID) | RESPIRATORY_TRACT | 0 refills | Status: DC
  Filled 2023-04-07: qty 12, 30d supply, fill #0

## 2023-07-14 ENCOUNTER — Ambulatory Visit: Payer: Self-pay | Admitting: Medical

## 2023-07-21 ENCOUNTER — Other Ambulatory Visit (HOSPITAL_COMMUNITY): Payer: Self-pay

## 2023-07-21 ENCOUNTER — Ambulatory Visit: Payer: Self-pay | Admitting: Medical

## 2023-07-21 ENCOUNTER — Encounter: Payer: Self-pay | Admitting: Hematology and Oncology

## 2023-07-21 VITALS — BP 130/80 | HR 101 | Wt 281.6 lb

## 2023-07-21 DIAGNOSIS — Z6841 Body Mass Index (BMI) 40.0 and over, adult: Secondary | ICD-10-CM

## 2023-07-21 DIAGNOSIS — R400 Somnolence: Secondary | ICD-10-CM | POA: Diagnosis not present

## 2023-07-21 DIAGNOSIS — R0683 Snoring: Secondary | ICD-10-CM

## 2023-07-21 DIAGNOSIS — K921 Melena: Secondary | ICD-10-CM

## 2023-07-21 DIAGNOSIS — R0681 Apnea, not elsewhere classified: Secondary | ICD-10-CM

## 2023-07-21 MED ORDER — HYDROCORTISONE ACETATE 25 MG RE SUPP
25.0000 mg | Freq: Two times a day (BID) | RECTAL | 0 refills | Status: DC
  Filled 2023-07-21: qty 12, 6d supply, fill #0

## 2023-07-21 NOTE — Progress Notes (Signed)
 Subjective:  Melissa Mason is a 38 y.o. female who presents for Chief Complaint  Patient presents with   Referral    CC: wants to discuss a referral for a sleep study, snoring at night going on for years      Here for concern for referral for sleep study.  No prior sleep study.  Here with Melissa Mason today.  She notes that she snores loud for years.  Melissa Mason does note noticeable apnea events, worse in last few months.   Gets daytime somnolence, not rested in the mornings.  She also has 46mo toddler, so sleep is not always the best in general.   Father has sleep apnea  She needs help with weight loss efforts.  She has struggled with trying to lose weight for a long time.  She is getting some exercise through walking.  She has not been on medication for weight loss before.  She notes small amounts of bright red blood per rectum several times in the past year.  This happens a few days every couple months.  It is random.  It is usually bright red on the top.  Sometimes in the toilet bowl as well.  No weakness or lightheadedness.  No heavy periods.  No obvious external hemorrhoids.  She has daily bowel movements.  Denies constipation.  No family history of colon cancer.  No other aggravating or relieving factors.    No other c/o.  Past Medical History:  Diagnosis Date   Anxiety    Asthma    Depression    Heartburn    Pre-diabetes    Reactive airway disease    Seasonal allergies    Vitamin D  deficiency    Current Outpatient Medications on File Prior to Visit  Medication Sig Dispense Refill   albuterol  (VENTOLIN  HFA) 108 (90 Base) MCG/ACT inhaler Inhale 1-2 puffs into the lungs every 6 (six) hours as needed for wheezing or shortness of breath. 1 each 0   cetirizine (ZYRTEC) 10 MG tablet Take 10 mg by mouth daily.     escitalopram  (LEXAPRO ) 20 MG tablet Take 1 tablet (20 mg total) by mouth daily. 90 tablet 1   Vitamin D , Ergocalciferol , (DRISDOL ) 1.25 MG (50000 UNIT) CAPS capsule Take 1  capsule (50,000 Units total) by mouth every 7 (seven) days. 12 capsule 3   benzonatate  (TESSALON ) 200 MG capsule Take 1 capsule (200 mg total) by mouth 3 (three) times daily as needed. 20 capsule 0   fluticasone  (FLOVENT  HFA) 110 MCG/ACT inhaler Inhale 1 puff into the lungs 2 (two) times daily for 7 days. 1 each 0   fluticasone  (FLOVENT  HFA) 110 MCG/ACT inhaler Inhale 1 puff into the lungs 2 (two) times daily for 7 days (Patient not taking: Reported on 07/21/2023) 12 g 0   oseltamivir  (TAMIFLU ) 75 MG capsule Take 1 capsule (75 mg total) by mouth every 12 (twelve) hours. 10 capsule 0   No current facility-administered medications on file prior to visit.    The following portions of the patient's history were reviewed and updated as appropriate: allergies, current medications, past family history, past medical history, past social history, past surgical history and problem list.  ROS Otherwise as in subjective above    Objective: BP 130/80   Pulse (!) 101   Wt 281 lb 9.6 oz (127.7 kg)   SpO2 97%   BMI 51.51 kg/m   Wt Readings from Last 3 Encounters:  07/21/23 281 lb 9.6 oz (127.7 kg)  10/06/22  278 lb (126.1 kg)  08/31/22 276 lb (125.2 kg)    General appearance: alert, no distress, well developed, well nourished     Assessment: Encounter Diagnoses  Name Primary?   Daytime somnolence Yes   Snoring    Witnessed apneic spells    Blood in stool    BMI 50.0-59.9, adult (HCC)    Morbid obesity (HCC)      Plan: Daytime somnolence, snoring, witnessed apnea-referral for sleep study through [facility] home sleep study.  Counseled on working on losing weight, not sleeping flat on Melissa back, and discussed potential treatments for sleep apnea  BMI greater than 50, morbid obesity-referral for weight loss study being conducted at a local trial.  Discussed other medicines that we could potentially use if she is not a candidate for the study.  Counseled on diet and  exercise.  Blood in stool, likely internal hemorrhoids , but given the ongoing nature of this referral to gastroenterologist.  In the meantime begin hot bath soaks over the next few days and rectal suppositories for the next 2 to 3 days as below   Kaylinn was seen today for referral.  Diagnoses and all orders for this visit:  Daytime somnolence  Snoring  Witnessed apneic spells  Blood in stool -     Ambulatory referral to Gastroenterology  BMI 50.0-59.9, adult (HCC)  Morbid obesity (HCC)  Other orders -     hydrocortisone  (ANUSOL -HC) 25 MG suppository; Place 1 suppository (25 mg total) rectally 2 (two) times daily.  Spent > 30 minutes face to face with patient in discussion of symptoms, evaluation, plan and recommendations.    Follow up: pending referrals

## 2023-07-21 NOTE — Patient Instructions (Signed)
 Check insurance medications coverage for the following weight loss medicaiton  Qsymia Contrave Wegovy  Zepbound

## 2023-07-22 ENCOUNTER — Encounter: Payer: Self-pay | Admitting: Hematology and Oncology

## 2023-07-22 ENCOUNTER — Other Ambulatory Visit (HOSPITAL_COMMUNITY): Payer: Self-pay

## 2023-07-22 NOTE — Progress Notes (Signed)
 Faxed over referral to [facility] for weight loss and [facility] for sleep study

## 2023-08-03 ENCOUNTER — Other Ambulatory Visit (HOSPITAL_COMMUNITY): Payer: Self-pay

## 2023-08-25 ENCOUNTER — Other Ambulatory Visit: Payer: Self-pay | Admitting: Gastroenterology

## 2023-09-02 ENCOUNTER — Encounter: Payer: BC Managed Care – PPO | Admitting: Medical

## 2023-09-06 ENCOUNTER — Ambulatory Visit: Payer: Self-pay | Admitting: Medical

## 2023-09-06 ENCOUNTER — Other Ambulatory Visit (HOSPITAL_BASED_OUTPATIENT_CLINIC_OR_DEPARTMENT_OTHER): Payer: Self-pay

## 2023-09-06 NOTE — Progress Notes (Signed)
 Surprisingly sleep study does not show any significant sleep apnea, it shows mild sleep apnea.  At her last visit we discussed referral for weight loss study.  I have not heard back about this.  Was she able to enter the weight loss study  Let me know about this and we will decide on follow-up.  Since the sleep study showed mild sleep apnea she could still potentially try a CPAP device to help with her symptoms but we would need to do a follow-up and discuss.  Let me know about the weight loss study

## 2023-09-21 ENCOUNTER — Encounter (HOSPITAL_COMMUNITY): Payer: Self-pay | Admitting: Gastroenterology

## 2023-09-21 NOTE — Progress Notes (Signed)
 Attempted to obtain medical history for pre op call via telephone, unable to reach at this time. HIPAA compliant voicemail message left requesting return call to pre surgical testing department.

## 2023-09-24 ENCOUNTER — Other Ambulatory Visit (HOSPITAL_BASED_OUTPATIENT_CLINIC_OR_DEPARTMENT_OTHER): Payer: Self-pay

## 2023-09-24 ENCOUNTER — Encounter: Payer: Self-pay | Admitting: Hematology and Oncology

## 2023-09-24 MED ORDER — DULCOLAX 5 MG PO TBEC
5.0000 mg | DELAYED_RELEASE_TABLET | ORAL | 0 refills | Status: DC
  Filled 2023-09-24: qty 25, 1d supply, fill #0

## 2023-09-24 MED ORDER — PEG 3350-KCL-NABCB-NACL-NASULF 236 G PO SOLR
4.0000 L | ORAL | 0 refills | Status: DC
  Filled 2023-09-24: qty 4000, 2d supply, fill #0

## 2023-09-28 ENCOUNTER — Ambulatory Visit (HOSPITAL_COMMUNITY)
Admission: RE | Admit: 2023-09-28 | Discharge: 2023-09-28 | Disposition: A | Discharge status: Home / Self Care | Attending: Gastroenterology | Admitting: Gastroenterology

## 2023-09-28 ENCOUNTER — Ambulatory Visit (HOSPITAL_COMMUNITY): Admitting: Certified Registered"

## 2023-09-28 ENCOUNTER — Encounter (HOSPITAL_COMMUNITY): Payer: Self-pay | Admitting: Gastroenterology

## 2023-09-28 ENCOUNTER — Encounter (HOSPITAL_COMMUNITY)
Admission: RE | Disposition: A | Discharge status: Home / Self Care | Payer: Self-pay | Source: Home / Self Care | Attending: Gastroenterology

## 2023-09-28 ENCOUNTER — Other Ambulatory Visit: Payer: Self-pay

## 2023-09-28 DIAGNOSIS — K640 First degree hemorrhoids: Secondary | ICD-10-CM | POA: Diagnosis not present

## 2023-09-28 DIAGNOSIS — K920 Hematemesis: Secondary | ICD-10-CM | POA: Diagnosis not present

## 2023-09-28 DIAGNOSIS — Z6841 Body Mass Index (BMI) 40.0 and over, adult: Secondary | ICD-10-CM | POA: Diagnosis not present

## 2023-09-28 DIAGNOSIS — E6689 Other obesity not elsewhere classified: Secondary | ICD-10-CM | POA: Diagnosis not present

## 2023-09-28 DIAGNOSIS — K921 Melena: Secondary | ICD-10-CM | POA: Insufficient documentation

## 2023-09-28 DIAGNOSIS — D127 Benign neoplasm of rectosigmoid junction: Secondary | ICD-10-CM

## 2023-09-28 DIAGNOSIS — K621 Rectal polyp: Secondary | ICD-10-CM | POA: Insufficient documentation

## 2023-09-28 DIAGNOSIS — J4489 Other specified chronic obstructive pulmonary disease: Secondary | ICD-10-CM | POA: Insufficient documentation

## 2023-09-28 DIAGNOSIS — F418 Other specified anxiety disorders: Secondary | ICD-10-CM

## 2023-09-28 DIAGNOSIS — K635 Polyp of colon: Secondary | ICD-10-CM | POA: Insufficient documentation

## 2023-09-28 DIAGNOSIS — J449 Chronic obstructive pulmonary disease, unspecified: Secondary | ICD-10-CM

## 2023-09-28 HISTORY — PX: COLONOSCOPY: SHX5424

## 2023-09-28 SURGERY — COLONOSCOPY
Anesthesia: Monitor Anesthesia Care

## 2023-09-28 MED ORDER — PROPOFOL 10 MG/ML IV BOLUS
INTRAVENOUS | Status: AC
Start: 2023-09-28 — End: 2023-09-28
  Filled 2023-09-28: qty 20

## 2023-09-28 MED ORDER — SODIUM CHLORIDE 0.9 % IV SOLN: INTRAVENOUS | Status: DC

## 2023-09-28 MED ORDER — LACTATED RINGERS IV SOLN: INTRAVENOUS | Status: DC | PRN

## 2023-09-28 MED ORDER — GLYCOPYRROLATE 0.2 MG/ML IJ SOLN
INTRAMUSCULAR | Status: DC | PRN
Start: 2023-09-28 — End: 2023-09-28
  Administered 2023-09-28: .2 mg via INTRAVENOUS

## 2023-09-28 MED ORDER — PROPOFOL 10 MG/ML IV BOLUS
INTRAVENOUS | Status: DC | PRN
  Administered 2023-09-28 (×2): 50 mg via INTRAVENOUS

## 2023-09-28 MED ORDER — PROPOFOL 500 MG/50ML IV EMUL
INTRAVENOUS | Status: DC | PRN
  Administered 2023-09-28: 150 ug/kg/min via INTRAVENOUS

## 2023-09-28 MED ORDER — PROPOFOL 500 MG/50ML IV EMUL
INTRAVENOUS | Status: AC
  Filled 2023-09-28: qty 50

## 2023-09-28 NOTE — H&P (Signed)
 Date of Initial H&P: 09/22/23  History reviewed, patient examined, no change in status, stable for surgery.

## 2023-09-28 NOTE — Anesthesia Preprocedure Evaluation (Addendum)
 Anesthesia Evaluation  Patient identified by MRN, date of birth, ID band Patient awake    Reviewed: Allergy & Precautions, NPO status , Patient's Chart, lab work & pertinent test results  History of Anesthesia Complications Negative for: history of anesthetic complications  Airway Mallampati: III  TM Distance: >3 FB Neck ROM: Full    Dental  (+) Dental Advisory Given   Pulmonary asthma , COPD,  COPD inhaler   breath sounds clear to auscultation       Cardiovascular negative cardio ROS  Rhythm:Regular Rate:Normal     Neuro/Psych   Anxiety Depression    negative neurological ROS     GI/Hepatic negative GI ROS, Neg liver ROS,,,  Endo/Other    Class 4 obesityBMI 51  Renal/GU negative Renal ROS     Musculoskeletal   Abdominal   Peds  Hematology   Anesthesia Other Findings   Reproductive/Obstetrics LMP 2 years ago                              Anesthesia Physical Anesthesia Plan  ASA: 3  Anesthesia Plan: MAC   Post-op Pain Management: Minimal or no pain anticipated   Induction:   PONV Risk Score and Plan: 2 and Treatment may vary due to age or medical condition  Airway Management Planned: Natural Airway and Simple Face Mask  Additional Equipment:   Intra-op Plan:   Post-operative Plan:   Informed Consent: I have reviewed the patients History and Physical, chart, labs and discussed the procedure including the risks, benefits and alternatives for the proposed anesthesia with the patient or authorized representative who has indicated his/her understanding and acceptance.     Dental advisory given  Plan Discussed with: CRNA and Surgeon  Anesthesia Plan Comments:          Anesthesia Quick Evaluation

## 2023-09-28 NOTE — Anesthesia Postprocedure Evaluation (Signed)
 Anesthesia Post Note  Patient: Melissa Mason  Procedure(s) Performed: COLONOSCOPY     Patient location during evaluation: PACU Anesthesia Type: MAC Level of consciousness: awake and alert, oriented and patient cooperative Pain management: pain level controlled Vital Signs Assessment: post-procedure vital signs reviewed and stable Respiratory status: spontaneous breathing, nonlabored ventilation and respiratory function stable Cardiovascular status: stable and blood pressure returned to baseline Postop Assessment: no apparent nausea or vomiting and able to ambulate Anesthetic complications: no   No notable events documented.  Last Vitals:  Vitals:   09/28/23 0950 09/28/23 1000  BP: 139/73 (!) 119/57  Pulse:    Resp: 15   Temp:    SpO2: 95%     Last Pain:  Vitals:   09/28/23 1000  TempSrc:   PainSc: 0-No pain                 Reynoldo Mainer,E. Shaquana Buel

## 2023-09-28 NOTE — Op Note (Signed)
 [facility] Patient Name: Melissa Mason Procedure Date: 09/28/2023 MRN: [id number] Attending MD: Jerrell JAYSON Sol , MD, [id number] Date of Birth: [birth year] CSN: [id number] Age: 38 Admit Type: Ambulatory Procedure:                Colonoscopy Indications:              This is the patient's first colonoscopy,                            Hematochezia Providers:                Jerrell KYM Sol, MD, Randall Lines, RN, [place]y [place], Technician Referring MD:             Alm RAMAN. Tysinger PA-C, PA Medicines:                Propofol  per Anesthesia, Monitored Anesthesia Care Complications:            No immediate complications. Estimated Blood Loss:     Estimated blood loss was minimal. Procedure:                Pre-Anesthesia Assessment:                           - Prior to the procedure, a History and Physical                            was performed, and patient medications and                            allergies were reviewed. The patient's tolerance of                            previous anesthesia was also reviewed. The risks                            and benefits of the procedure and the sedation                            options and risks were discussed with the patient.                            All questions were answered, and informed consent                            was obtained. Prior Anticoagulants: The patient has                            taken no anticoagulant or antiplatelet agents. ASA                            Grade Assessment: III - A patient with severe  systemic disease. After reviewing the risks and                            benefits, the patient was deemed in satisfactory                            condition to undergo the procedure.                           After obtaining informed consent, the colonoscope                            was passed under direct vision. Throughout the                             procedure, the patient's blood pressure, pulse, and                            oxygen saturations were monitored continuously. The                            [id number]-HQ190L ([id number] Olympus colonoscope was                            introduced through the anus and advanced to the the                            cecum, identified by appendiceal orifice and                            ileocecal valve. The colonoscopy was performed                            without difficulty. The patient tolerated the                            procedure well. The quality of the bowel                            preparation was adequate and good. The ileocecal                            valve, appendiceal orifice, and rectum were                            photographed. Scope In: 9:11:23 AM Scope Out: 9:25:33 AM Scope Withdrawal Time: 0 hours 10 minutes 25 seconds  Total Procedure Duration: 0 hours 14 minutes 10 seconds  Findings:      The perianal and digital rectal examinations were normal.      Five flat and semi-sessile polyps were found in the rectum and sigmoid       colon. The polyps were 1 to 3 mm in size. These polyps were removed with       a cold biopsy forceps. Resection and retrieval were complete. Estimated  blood loss was minimal.      Internal hemorrhoids were found during retroflexion. The hemorrhoids       were small and Grade I (internal hemorrhoids that do not prolapse). Impression:               - Five 1 to 3 mm polyps in the rectum and in the                            sigmoid colon, removed with a cold biopsy forceps.                            Resected and retrieved.                           - Internal hemorrhoids. Moderate Sedation:      N/A - MAC procedure Recommendation:           - Patient has a contact number available for                            emergencies. The signs and symptoms of potential                            delayed complications were  discussed with the                            patient. Return to normal activities tomorrow.                            Written discharge instructions were provided to the                            patient.                           - High fiber diet.                           - Await pathology results. Procedure Code(s):        --- Professional ---                           [phone], Colonoscopy, flexible; with biopsy, single                            or multiple Diagnosis Code(s):        --- Professional ---                           K92.1, Melena (includes Hematochezia)                           D12.8, Benign neoplasm of rectum                           D12.5, Benign neoplasm of sigmoid colon  K64.0, First degree hemorrhoids CPT copyright 2022 American Medical [employer]. All rights reserved. The codes documented in this report are preliminary and upon coder review may  be revised to meet current compliance requirements. Jerrell JAYSON Sol, MD 09/28/2023 9:39:51 AM This report has been signed electronically. Number of Addenda: 0

## 2023-09-28 NOTE — Discharge Instructions (Signed)

## 2023-09-28 NOTE — Interval H&P Note (Signed)
 History and Physical Interval Note:  09/28/2023 8:53 AM  Melissa Mason  has presented today for surgery, with the diagnosis of Blood in stool.  The various methods of treatment have been discussed with the patient and family. After consideration of risks, benefits and other options for treatment, the patient has consented to  Procedure(s): COLONOSCOPY (N/A) as a surgical intervention.  The patient's history has been reviewed, patient examined, no change in status, stable for surgery.  I have reviewed the patient's chart and labs.  Questions were answered to the patient's satisfaction.     Jerrell JAYSON Sol

## 2023-09-28 NOTE — Transfer of Care (Signed)
 Immediate Anesthesia Transfer of Care Note  Patient: Melissa Mason  Procedure(s) Performed: COLONOSCOPY  Patient Location: PACU  Anesthesia Type:MAC  Level of Consciousness: awake, alert , oriented, and patient cooperative  Airway & Oxygen Therapy: Patient Spontanous Breathing and Patient connected to face mask oxygen  Post-op Assessment: Report given to RN and Post -op Vital signs reviewed and stable  Post vital signs: Reviewed and stable  Last Vitals:  Vitals Value Taken Time  BP 126/82 09/28/23 09:40  Temp    Pulse 70 09/28/23 09:38  Resp 18 09/28/23 09:42  SpO2 99 % 09/28/23 09:38  Vitals shown include unfiled device data.  Last Pain:  Vitals:   09/28/23 0835  TempSrc: Temporal  PainSc: 0-No pain         Complications: No notable events documented.

## 2023-09-29 LAB — SURGICAL PATHOLOGY

## 2023-10-02 ENCOUNTER — Encounter (HOSPITAL_COMMUNITY): Payer: Self-pay | Admitting: Gastroenterology

## 2023-10-11 ENCOUNTER — Ambulatory Visit: Payer: BC Managed Care – PPO | Admitting: Nurse Practitioner

## 2023-10-18 ENCOUNTER — Ambulatory Visit: Admitting: Medical

## 2023-10-18 ENCOUNTER — Other Ambulatory Visit (HOSPITAL_COMMUNITY): Payer: Self-pay

## 2023-10-18 VITALS — BP 110/70 | HR 99 | Ht 62.5 in | Wt 277.8 lb

## 2023-10-18 DIAGNOSIS — J4520 Mild intermittent asthma, uncomplicated: Secondary | ICD-10-CM | POA: Diagnosis not present

## 2023-10-18 DIAGNOSIS — R7301 Impaired fasting glucose: Secondary | ICD-10-CM

## 2023-10-18 DIAGNOSIS — Z0000 Encounter for general adult medical examination without abnormal findings: Secondary | ICD-10-CM | POA: Diagnosis not present

## 2023-10-18 DIAGNOSIS — F411 Generalized anxiety disorder: Secondary | ICD-10-CM

## 2023-10-18 DIAGNOSIS — E559 Vitamin D deficiency, unspecified: Secondary | ICD-10-CM

## 2023-10-18 DIAGNOSIS — D72829 Elevated white blood cell count, unspecified: Secondary | ICD-10-CM | POA: Diagnosis not present

## 2023-10-18 DIAGNOSIS — Z6841 Body Mass Index (BMI) 40.0 and over, adult: Secondary | ICD-10-CM

## 2023-10-18 DIAGNOSIS — G4730 Sleep apnea, unspecified: Secondary | ICD-10-CM

## 2023-10-18 MED ORDER — TIRZEPATIDE-WEIGHT MANAGEMENT 5 MG/0.5ML ~~LOC~~ SOAJ
5.0000 mg | SUBCUTANEOUS | 0 refills | Status: DC
  Filled 2023-10-18: qty 2, 28d supply, fill #0

## 2023-10-18 MED ORDER — TIRZEPATIDE-WEIGHT MANAGEMENT 2.5 MG/0.5ML ~~LOC~~ SOAJ
2.5000 mg | SUBCUTANEOUS | 0 refills | Status: DC
  Filled 2023-10-18: qty 2, 28d supply, fill #0

## 2023-10-18 NOTE — Progress Notes (Addendum)
 Subjective:   HPI  Melissa Mason is a 38 y.o. female who presents for Chief Complaint  Patient presents with   Annual Exam    Nonfasting cpe, had something to eat at 12pm. Discuss sleep study if its recommended she needs CPAP    Patient Care Team: Tysinger, Alm GORMAN RIGGERS as PCP - General (Family Medicine) Sees dentist Sees eye doctor Dr. Kate Nearing, gynecology Dr. Mackey Chad, hematology   Concerns: She has a lesion on her left arm that has gotten little bit bigger over time.  Has been there more than a year.  She would like it removed.  She is interested in treatment for prediabetes.  She has a history of PCOS.  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 Jill Jertson, gynecology   Reviewed their medical, surgical, family, social, medication, and allergy history and updated chart as appropriate.  Past Medical History:  Diagnosis Date   Anxiety    Asthma    Depression    Heartburn    Pre-diabetes    Reactive airway disease    Seasonal allergies    Vitamin D  deficiency     Family History  Problem Relation Age of Onset   Depression Mother    Obesity Mother    Hypertension Father    Sleep apnea Father    Obesity Father    Cancer Father        skin, basal   Cancer Maternal Grandmother        breast   Diabetes Maternal Grandmother    Cancer Maternal Grandfather        prostate   Cancer Paternal Grandmother        breast, lymphoma, skin   Alzheimer's disease Paternal Grandfather    Diabetes Paternal Grandfather    Heart disease Neg Hx    Stroke Neg Hx      Current Outpatient Medications:    albuterol  (VENTOLIN  HFA) 108 (90 Base) MCG/ACT inhaler, Inhale 1-2 puffs into the lungs every 6 (six) hours as needed for wheezing or shortness of breath., Disp: 1 each, Rfl: 0   cetirizine (ZYRTEC) 10 MG tablet, Take 10 mg by mouth daily.,  Disp: , Rfl:    escitalopram  (LEXAPRO ) 20 MG tablet, Take 1 tablet (20 mg total) by mouth daily., Disp: 90 tablet, Rfl: 1   tirzepatide  (ZEPBOUND ) 2.5 MG/0.5ML Pen, Inject 2.5 mg into the skin once a week., Disp: 2 mL, Rfl: 0   tirzepatide  (ZEPBOUND ) 5 MG/0.5ML Pen, Inject 5 mg into the skin once a week., Disp: 2 mL, Rfl: 0   Vitamin D , Ergocalciferol , (DRISDOL ) 1.25 MG (50000 UNIT) CAPS capsule, Take 1 capsule (50,000 Units total) by mouth every 7 (seven) days., Disp: 12 capsule, Rfl: 3   bisacodyl  (DULCOLAX) 5 MG EC tablet, Take 1 tablet (5 mg total) by mouth as directed., Disp: 25 tablet, Rfl: 0  Allergies  Allergen Reactions   Pollen Extract Other (See Comments)    Sneezing sneezing    Review of Systems  Constitutional:  Negative for chills, fever, malaise/fatigue and weight loss.  HENT:  Negative for congestion, ear pain, hearing loss, sore throat and tinnitus.   Eyes:  Negative for blurred vision, pain and redness.  Respiratory:  Negative for cough, hemoptysis and shortness of breath.   Cardiovascular:  Negative for chest pain, palpitations, orthopnea, claudication and leg swelling.  Gastrointestinal:  Negative for abdominal pain, blood in stool, constipation, diarrhea, nausea and vomiting.  Genitourinary:  Negative for dysuria, flank pain, frequency, hematuria and urgency.  Musculoskeletal:  Negative for falls, joint pain and myalgias.  Skin:  Negative for itching and rash.  Neurological:  Negative for dizziness, tingling, speech change, weakness and headaches.  Endo/Heme/Allergies:  Negative for polydipsia. Does not bruise/bleed easily.  Psychiatric/Behavioral:  Negative for depression and memory loss. The patient is not nervous/anxious and does not have insomnia.          10/18/2023    2:02 PM 08/31/2022    2:16 PM 04/09/2021    2:39 PM 11/14/2020   11:26 AM 09/25/2019    3:21 PM  Depression screen PHQ 2/9  Decreased Interest 0 0 0 0 0  Down, Depressed, Hopeless 0 1 0 0 0   PHQ - 2 Score 0 1 0 0 0        Objective:  BP 110/70   Pulse 99   Ht 5' 2.5 (1.588 m)   Wt 277 lb 12.5 oz (126 kg)   SpO2 98%   BMI 50.00 kg/m   BP Readings from Last 3 Encounters:  10/18/23 110/70  09/28/23 (!) 119/57  07/21/23 130/80   Wt Readings from Last 3 Encounters:  10/18/23 277 lb 12.5 oz (126 kg)  09/21/23 279 lb 15.8 oz (127 kg)  07/21/23 281 lb 9.6 oz (127.7 kg)   General appearance: alert, no distress, WD/WN, Caucasian female Skin: left posterior forearm mid shaft with raised somewhat dense 5mm pink/red lesion, otherwise unremarkable HEENT: normocephalic, conjunctiva/corneas normal, sclerae anicteric, PERRLA, EOMi, Neck: supple, no lymphadenopathy, no thyromegaly, no masses, normal ROM, no bruits Chest: non tender, normal shape and expansion Heart: RRR, normal S1, S2, no murmurs Lungs: CTA bilaterally, no wheezes, rhonchi, or rales Abdomen: +bs, soft, non tender, non distended, no masses, no hepatomegaly, no splenomegaly, no bruits Back: non tender, normal ROM, no scoliosis Musculoskeletal: upper extremities non tender, no obvious deformity, normal ROM throughout, lower extremities non tender, no obvious deformity, normal ROM throughout Extremities: no edema, no cyanosis, no clubbing Pulses: 2+ symmetric, upper and lower extremities, normal cap refill Neurological: alert, oriented x 3, CN2-12 intact, strength normal upper extremities and lower extremities, sensation normal throughout, DTRs 2+ throughout, no cerebellar signs, gait normal Psychiatric: normal affect, behavior normal, pleasant  Breast/gyn/rectal - deferred to gynecology     Assessment and Plan :   Encounter Diagnoses  Name Primary?   Encounter for health maintenance examination in adult Yes   Vitamin D  deficiency    Mild intermittent asthma, unspecified whether complicated    Leukocytosis, unspecified type    Impaired fasting blood sugar    Generalized anxiety disorder    Sleep apnea,  unspecified type    BMI 50.0-59.9, adult (HCC)      This visit was a preventative care visit, also known as wellness visit or routine physical.   Topics typically include healthy lifestyle, diet, exercise, preventative care, vaccinations, sick and well care, proper use of emergency dept and after hours care, as well as other concerns.     Recommendations: Continue to return yearly for your annual wellness and preventative care visits.  This gives us  a chance to discuss healthy lifestyle, exercise, vaccinations, review your chart record, and perform screenings where appropriate.  I recommend you see your eye doctor yearly for routine vision care.  I recommend you see your dentist yearly for routine dental care including hygiene visits twice yearly.  See your gynecologist yearly for routine gynecological care.    Vaccination recommendations were reviewed Immunization History  Administered Date(s) Administered   Hepatitis B 12/06/1995, 01/10/1996, 06/12/1996, 03/18/2015   Influenza Split 07/19/2012, 12/06/2020, 12/04/2021   Influenza,inj,quad, With Preservative 11/26/2017   Influenza-Unspecified 07/19/2012, 12/23/2014, 12/02/2015, 11/30/2016, 11/26/2017, 12/02/2018, 12/01/2019   MMR 03/28/1986, 01/21/1993   PFIZER(Purple Top)SARS-COV-2 Vaccination 03/01/2019, 03/20/2019, 12/15/2019   Pneumococcal Polysaccharide-23 05/09/2021   Tdap 05/12/2010, 11/07/2020   Vaxelis (DTaP,IPV,Hib,HepB) 10/19/2007   Yearly flu shot at work   Screening for cancer: Colon cancer screening: Age 42  Breast cancer screening: You should perform a self breast exam monthly.   We reviewed recommendations for regular mammograms and breast cancer screening.  Cervical cancer screening: We reviewed recommendations for pap smear screening.   Skin cancer screening: Check your skin regularly for new changes, growing lesions, or other lesions of concern Come in for evaluation if you have skin lesions of  concern.  Lung cancer screening: If you have a greater than 20 pack year history of tobacco use, then you may qualify for lung cancer screening with a chest CT scan.   Please call your insurance company to inquire about coverage for this test.  We currently don't have screenings for other cancers besides breast, cervical, colon, and lung cancers.  If you have a strong family history of cancer or have other cancer screening concerns, please let me know.    Bone health: Get at least 150 minutes of aerobic exercise weekly Get weight bearing exercise at least once weekly Bone density test:  A bone density test is an imaging test that uses a type of X-ray to measure the amount of calcium and other minerals in your bones. The test may be used to diagnose or screen you for a condition that causes weak or thin bones (osteoporosis), predict your risk for a broken bone (fracture), or determine how well your osteoporosis treatment is working. The bone density test is recommended for females 65 and older, or females or males <65 if certain risk factors such as thyroid  disease, long term use of steroids such as for asthma or rheumatological issues, vitamin D  deficiency, estrogen deficiency, family history of osteoporosis, self or family history of fragility fracture in first degree relative.    Heart health: Get at least 150 minutes of aerobic exercise weekly Limit alcohol It is important to maintain a healthy blood pressure and healthy cholesterol numbers  Heart disease screening: Screening for heart disease includes screening for blood pressure, fasting lipids, glucose/diabetes screening, BMI height to weight ratio, reviewed of smoking status, physical activity, and diet.    Goals include blood pressure 120/80 or less, maintaining a healthy lipid/cholesterol profile, preventing diabetes or keeping diabetes numbers under good control, not smoking or using tobacco products, exercising most days per week  or at least 150 minutes per week of exercise, and eating healthy variety of fruits and vegetables, healthy oils, and avoiding unhealthy food choices like fried food, fast food, high sugar and high cholesterol foods.    Other tests may possibly include EKG test, CT coronary calcium score, echocardiogram, exercise treadmill stress test.     Medical care options: I recommend you continue to seek care here first for routine care.  We try really hard to have available appointments Monday through Friday daytime hours for sick visits, acute visits, and physicals.  Urgent care should be used for after hours and weekends for significant issues that cannot wait till the next day.  The  emergency department should be used for significant potentially life-threatening emergencies.  The emergency department is expensive, can often have long wait times for less significant concerns, so try to utilize primary care, urgent care, or telemedicine when possible to avoid unnecessary trips to the emergency department.  Virtual visits and telemedicine have been introduced since the pandemic started in 2020, and can be convenient ways to receive medical care.  We offer virtual appointments as well to assist you in a variety of options to seek medical care.    Separate significant issues discussed: Asthma-mild intermittent, continue albuterol  as needed.    Vitamin D  deficiency-labs today, continue supplement  Mild sleep apnea on study 08/2023.  Work on efforts to lose weight through healthy diet and exercise.  Trial of zepbound  for OSA.  Discussed risks/benefits and proper use of medication  Obesity-continue efforts to lose weight through healthy diet and exercise.    Skin lesion - return for punch biopsy (6mm punch left forearm posteriorly).  Discussed options for treatment.     Farra was seen today for annual exam.  Diagnoses and all orders for this visit:  Encounter for health maintenance examination in adult -      Comprehensive metabolic panel with GFR -     CBC with Differential/Platelet -     Lipid panel -     VITAMIN D  25 Hydroxy (Vit-D Deficiency, Fractures) -     Hemoglobin A1c -     TSH -     Lactate dehydrogenase  Vitamin D  deficiency -     VITAMIN D  25 Hydroxy (Vit-D Deficiency, Fractures)  Mild intermittent asthma, unspecified whether complicated  Leukocytosis, unspecified type -     CBC with Differential/Platelet -     TSH -     Lactate dehydrogenase  Impaired fasting blood sugar -     Hemoglobin A1c  Generalized anxiety disorder  Sleep apnea, unspecified type  BMI 50.0-59.9, adult (HCC)  Other orders -     tirzepatide  (ZEPBOUND ) 2.5 MG/0.5ML Pen; Inject 2.5 mg into the skin once a week. -     tirzepatide  (ZEPBOUND ) 5 MG/0.5ML Pen; Inject 5 mg into the skin once a week.   Follow-up pending labs, yearly for physical

## 2023-10-19 ENCOUNTER — Other Ambulatory Visit (HOSPITAL_COMMUNITY): Payer: Self-pay

## 2023-10-19 ENCOUNTER — Other Ambulatory Visit

## 2023-10-19 ENCOUNTER — Ambulatory Visit: Payer: Self-pay | Admitting: Nurse Practitioner

## 2023-10-19 ENCOUNTER — Encounter: Payer: Self-pay | Admitting: Hematology and Oncology

## 2023-10-19 NOTE — Telephone Encounter (Signed)
 Melissa Mason would  try to get Zepbound  approved for sleep apnea as it is definitely indicated for sleep apnea,

## 2023-10-19 NOTE — Progress Notes (Deleted)
   Melissa Mason 1986/01/22 [id number]   History:  38 y.o. G0 presents for annual exam. Mirena  IUD 11/2021 for management of irregular, heavy periods and anemia. H/O PCOS. Has had iron  transfusions in the past. Negative EMB 09/2021. Normal pap history. Vit D deficiency, pre-diabetes managed by PCP.   Gynecologic History No LMP recorded. (Menstrual status: IUD).   Contraception/Family planning: IUD, female partner Sexually active: Yes  Health Maintenance Last Pap: 10/02/2021. Results were: Normal neg HPV Last mammogram: Not indicated  Last colonoscopy: Not indicated  Last Dexa: Not indicated       10/18/2023    2:02 PM  Depression screen PHQ 2/9  Decreased Interest 0  Down, Depressed, Hopeless 0  PHQ - 2 Score 0     Past medical history, past surgical history, family history and social history were all reviewed and documented in the EPIC chart. Married. 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 Wife is travel Engineer, civil (consulting). 44 yo son, 54 yo daughter, daughter due in October.   ROS:  A ROS was performed and pertinent positives and negatives are included.  Exam:  There were no vitals filed for this visit.  There is no height or weight on file to calculate BMI.  General appearance:  Normal Thyroid :  Symmetrical, normal in size, without palpable masses or nodularity. Respiratory  Auscultation:  Clear without wheezing or rhonchi Cardiovascular  Auscultation:  Regular rate, without rubs, murmurs or gallops  Edema/varicosities:  Not grossly evident Abdominal  Soft,nontender, without masses, guarding or rebound.  Liver/spleen:  No organomegaly noted  Hernia:  None appreciated  Skin  Inspection:  Grossly normal Breasts: Examined lying and sitting.   Right: Without masses, retractions, nipple discharge or axillary adenopathy.   Left: Without masses, retractions, nipple discharge or axillary adenopathy. Pelvic: External genitalia:  no lesions              Urethra:  normal appearing  urethra with no masses, tenderness or lesions              Bartholins and Skenes: normal                 Vagina: normal appearing vagina with normal color and discharge, no lesions              Cervix: no lesions. + IUD strings Bimanual Exam:  Uterus:  no masses or tenderness              Adnexa: no mass, fullness, tenderness              Rectovaginal: Deferred              Anus:  normal, no lesions  Zada Louder, CMA present as chaperone.   Assessment/Plan:  38 y.o. G0 for annual exam.   Well female exam with routine gynecological exam - Education provided on SBEs, importance of preventative screenings, current guidelines, high calcium diet, regular exercise, and multivitamin daily.  Labs with PCP.    Encounter for routine checking of intrauterine contraceptive device (IUD) - Mirena  IUD 11/2021. Aware of 8-year FDA approval.   Screening for cervical cancer - Normal Pap history.  Will repeat at 5-year interval per guidelines.  No follow-ups on file.   Annabella DELENA Shutter DNP, 8:56 AM 10/19/2023

## 2023-10-20 ENCOUNTER — Other Ambulatory Visit

## 2023-10-20 ENCOUNTER — Other Ambulatory Visit (HOSPITAL_COMMUNITY): Payer: Self-pay

## 2023-10-20 ENCOUNTER — Encounter: Payer: Self-pay | Admitting: Hematology and Oncology

## 2023-10-20 ENCOUNTER — Telehealth: Payer: Self-pay

## 2023-10-20 NOTE — Telephone Encounter (Signed)
 P/A REQUESTED FOR ZEPBOUND 

## 2023-10-21 ENCOUNTER — Ambulatory Visit: Payer: Self-pay | Admitting: Medical

## 2023-10-21 ENCOUNTER — Encounter: Payer: Self-pay | Admitting: Hematology and Oncology

## 2023-10-21 ENCOUNTER — Other Ambulatory Visit (HOSPITAL_COMMUNITY): Payer: Self-pay

## 2023-10-21 ENCOUNTER — Other Ambulatory Visit: Payer: Self-pay | Admitting: Medical

## 2023-10-21 DIAGNOSIS — J4521 Mild intermittent asthma with (acute) exacerbation: Secondary | ICD-10-CM

## 2023-10-21 LAB — TSH: TSH: 2.53 u[IU]/mL (ref 0.450–4.500)

## 2023-10-21 LAB — COMPREHENSIVE METABOLIC PANEL WITH GFR
ALT: 20 IU/L (ref 0–32)
AST: 14 IU/L (ref 0–40)
Albumin: 4.4 g/dL (ref 3.9–4.9)
Alkaline Phosphatase: 90 IU/L (ref 44–121)
BUN/Creatinine Ratio: 23 (ref 9–23)
BUN: 16 mg/dL (ref 6–20)
Bilirubin Total: 0.5 mg/dL (ref 0.0–1.2)
CO2: 21 mmol/L (ref 20–29)
Calcium: 9.3 mg/dL (ref 8.7–10.2)
Chloride: 102 mmol/L (ref 96–106)
Creatinine, Ser: 0.69 mg/dL (ref 0.57–1.00)
Globulin, Total: 2.8 g/dL (ref 1.5–4.5)
Glucose: 82 mg/dL (ref 70–99)
Potassium: 4.4 mmol/L (ref 3.5–5.2)
Sodium: 139 mmol/L (ref 134–144)
Total Protein: 7.2 g/dL (ref 6.0–8.5)
eGFR: 113 mL/min/1.73 (ref >59)

## 2023-10-21 LAB — LIPID PANEL
Chol/HDL Ratio: 3.7 ratio (ref 0.0–4.4)
Cholesterol, Total: 170 mg/dL (ref 100–199)
HDL: 46 mg/dL (ref >39)
LDL Chol Calc (NIH): 114 mg/dL — ABNORMAL HIGH (ref 0–99)
Triglycerides: 52 mg/dL (ref 0–149)
VLDL Cholesterol Cal: 10 mg/dL (ref 5–40)

## 2023-10-21 LAB — CBC WITH DIFFERENTIAL/PLATELET
Basophils Absolute: 0.1 x10E3/uL (ref 0.0–0.2)
Basos: 1 %
EOS (ABSOLUTE): 0.2 x10E3/uL (ref 0.0–0.4)
Eos: 1 %
Hematocrit: 39.5 % (ref 34.0–46.6)
Hemoglobin: 12.6 g/dL (ref 11.1–15.9)
Immature Grans (Abs): 0.1 x10E3/uL (ref 0.0–0.1)
Immature Granulocytes: 1 %
Lymphocytes Absolute: 2.2 x10E3/uL (ref 0.7–3.1)
Lymphs: 20 %
MCH: 28.3 pg (ref 26.6–33.0)
MCHC: 31.9 g/dL (ref 31.5–35.7)
MCV: 89 fL (ref 79–97)
Monocytes Absolute: 0.8 x10E3/uL (ref 0.1–0.9)
Monocytes: 7 %
Neutrophils Absolute: 7.6 x10E3/uL — ABNORMAL HIGH (ref 1.4–7.0)
Neutrophils: 70 %
Platelets: 428 x10E3/uL (ref 150–450)
RBC: 4.46 x10E6/uL (ref 3.77–5.28)
RDW: 12.4 % (ref 11.7–15.4)
WBC: 10.8 x10E3/uL (ref 3.4–10.8)

## 2023-10-21 LAB — HEMOGLOBIN A1C
Est. average glucose Bld gHb Est-mCnc: 120 mg/dL
Hgb A1c MFr Bld: 5.8 % — ABNORMAL HIGH (ref 4.8–5.6)

## 2023-10-21 LAB — LACTATE DEHYDROGENASE: LDH: 194 IU/L (ref 119–226)

## 2023-10-21 LAB — VITAMIN D 25 HYDROXY (VIT D DEFICIENCY, FRACTURES): Vit D, 25-Hydroxy: 25 ng/mL — ABNORMAL LOW (ref 30.0–100.0)

## 2023-10-21 MED ORDER — VITAMIN D 50 MCG (2000 UT) PO TABS
2000.0000 [IU] | ORAL_TABLET | Freq: Every day | ORAL | 3 refills | Status: AC
  Filled 2023-10-21 (×2): qty 100, 100d supply, fill #0

## 2023-10-21 MED ORDER — ESCITALOPRAM OXALATE 20 MG PO TABS
20.0000 mg | ORAL_TABLET | Freq: Every day | ORAL | 3 refills | Status: AC
  Filled 2023-10-21: qty 90, 90d supply, fill #0

## 2023-10-21 MED ORDER — ALBUTEROL SULFATE HFA 108 (90 BASE) MCG/ACT IN AERS
1.0000 | INHALATION_SPRAY | Freq: Four times a day (QID) | RESPIRATORY_TRACT | 2 refills | Status: AC | PRN
  Filled 2023-10-21: qty 6.7, 17d supply, fill #0

## 2023-10-21 NOTE — Progress Notes (Signed)
 Results through MyChart

## 2023-10-28 ENCOUNTER — Other Ambulatory Visit (HOSPITAL_COMMUNITY): Payer: Self-pay

## 2023-12-17 ENCOUNTER — Other Ambulatory Visit: Payer: Self-pay

## 2023-12-22 ENCOUNTER — Ambulatory Visit: Admitting: Nurse Practitioner

## 2024-01-20 ENCOUNTER — Ambulatory Visit: Admitting: Medical

## 2024-01-26 ENCOUNTER — Other Ambulatory Visit (HOSPITAL_COMMUNITY): Payer: Self-pay

## 2024-01-26 ENCOUNTER — Ambulatory Visit: Admitting: Medical

## 2024-01-26 VITALS — BP 116/76 | HR 92 | Wt 268.0 lb

## 2024-01-26 DIAGNOSIS — E559 Vitamin D deficiency, unspecified: Secondary | ICD-10-CM | POA: Diagnosis not present

## 2024-01-26 DIAGNOSIS — F411 Generalized anxiety disorder: Secondary | ICD-10-CM | POA: Diagnosis not present

## 2024-01-26 DIAGNOSIS — R454 Irritability and anger: Secondary | ICD-10-CM | POA: Diagnosis not present

## 2024-01-26 DIAGNOSIS — R41840 Attention and concentration deficit: Secondary | ICD-10-CM

## 2024-01-26 MED ORDER — METHYLPHENIDATE HCL ER (OSM) 18 MG PO TBCR
18.0000 mg | EXTENDED_RELEASE_TABLET | Freq: Every day | ORAL | 0 refills | Status: AC
  Filled 2024-01-26 – 2024-01-28 (×3): qty 30, 30d supply, fill #0

## 2024-01-26 NOTE — Progress Notes (Signed)
 Subjective:  Melissa Mason is a 38 y.o. female who presents for Chief Complaint  Patient presents with   Consult    Discuss anxiety and medications     Melissa Mason is a 38 year old female who presents with concerns about the effectiveness of her current medication regimen for mood and anxiety symptoms.  She experiences increased irritability and decreased productivity while on Lexapro , feeling 'lazy' and having 'less patience.' She is unsure if these feelings are due to depression or normal life stressors. No thoughts of self-harm or feelings of impending doom. Her wife has noticed similar mood changes, describing her as more irritable and moody.  Her sleep is reportedly fine, with no issues. She has a history of using Lexapro  in combination with Buspar  but did not notice a significant difference when Buspar  was discontinued. She recalls being warned about serotonin syndrome when using both medications together. She has not been on any other medications for mood or anxiety besides a beta blocker, which caused dizziness and low blood pressure.  She experiences moments of feeling overwhelmed, particularly at work, but manages to complete tasks despite these feelings. She sometimes feels scatterbrained and has difficulty focusing on tasks unless she is set on completing them. She occasionally experiences panic-like symptoms where she feels shaky and needs to take a moment to calm down.  She has not seen a counselor in the past year and feels she does not have time for it. She works full-time and has three young children, aged one, five, and seven, which contributes to her stress and time constraints.   No other aggravating or relieving factors. No other complaint.  Past Medical History:  Diagnosis Date   Anxiety    Asthma    Depression    Heartburn    Pre-diabetes    Reactive airway disease    Seasonal allergies    Vitamin D  deficiency    Current Outpatient Medications on File Prior  to Visit  Medication Sig Dispense Refill   albuterol  (VENTOLIN  HFA) 108 (90 Base) MCG/ACT inhaler Inhale 1-2 puffs into the lungs every 6 (six) hours as needed for wheezing or shortness of breath. 6.7 g 2   cetirizine (ZYRTEC) 10 MG tablet Take 10 mg by mouth daily.     Cholecalciferol  (VITAMIN D ) 50 MCG (2000 UT) tablet Take 1 tablet (2,000 Units total) by mouth daily. 90 tablet 3   escitalopram  (LEXAPRO ) 20 MG tablet Take 1 tablet (20 mg total) by mouth daily. 90 tablet 3   No current facility-administered medications on file prior to visit.   ROS as in subjective   Objective: BP 116/76   Pulse 92   Wt 268 lb (121.6 kg)   SpO2 97%   BMI 48.24 kg/m   Gen: wd, wn nad Psych: pleasant, good eye contact, answers questions appropriately    Assessment: Encounter Diagnoses  Name Primary?   Irritability Yes   Attention and concentration deficit    Vitamin D  deficiency    Generalized anxiety disorder       Plan: We discussed her symptoms and exam.  I reviewed questionnaires including PHQ-9, GAD-7, mood questionnaire and ADHD questionnaire  PHQ-9 score 3.  GAD-7 score of 15  Mood questionnaire 7 positive answers,  ADHD questionnaire 23 and 18.   Advised counseling Advised starting your day with a word of gratitude Exercise most days per week, including your kids in physical activity to ensure you are getting exercise Eat healthy foods Continue to get  good sleep Continue Lexapro  20 mg daily for now.  Consider other options going forward She has tried BuSpar  in the past as well Begin trial of methylphenidate 18 mg daily.  Discussed risk and benefits of proper use of medication Follow-up in 3 to 4 weeks, sooner if needed    Fabiana was seen today for consult.  Diagnoses and all orders for this visit:  Irritability  Attention and concentration deficit  Vitamin D  deficiency  Generalized anxiety disorder  Other orders -     methylphenidate (CONCERTA) 18 MG PO CR  tablet; Take 1 tablet (18 mg total) by mouth daily.    F/u 9mo

## 2024-01-27 ENCOUNTER — Telehealth (HOSPITAL_COMMUNITY): Payer: Self-pay

## 2024-01-27 ENCOUNTER — Other Ambulatory Visit (HOSPITAL_COMMUNITY): Payer: Self-pay

## 2024-01-27 ENCOUNTER — Other Ambulatory Visit (HOSPITAL_BASED_OUTPATIENT_CLINIC_OR_DEPARTMENT_OTHER): Payer: Self-pay

## 2024-01-27 ENCOUNTER — Encounter: Payer: Self-pay | Admitting: Hematology and Oncology

## 2024-01-27 NOTE — Telephone Encounter (Signed)
 PA request has been Received. New Encounter has been or will be created for follow up. For additional info see Pharmacy Prior Auth telephone encounter from 01/27/24.

## 2024-01-27 NOTE — Telephone Encounter (Signed)
 Pharmacy Patient Advocate Encounter   Received notification from Pt Calls Messages that prior authorization for Methylphenidate HCl ER TBCR 18MG  tablets  is required/requested.   Insurance verification completed.   The patient is insured through [facility].   Per test claim: PA required; PA submitted to above mentioned insurance via Latent Key/confirmation #/EOC [id number] Status is pending

## 2024-01-27 NOTE — Telephone Encounter (Signed)
 Pharmacy Patient Advocate Encounter  Received notification from [facility] that Prior Authorization for Methylphenidate  HCl ER TBCR 18MG  tablets  has been DENIED.  See denial reason below. No denial letter attached in CMM. Will attach denial letter to Media tab once received.   PA #/Case ID/Reference #: [id number]

## 2024-01-28 ENCOUNTER — Other Ambulatory Visit (HOSPITAL_BASED_OUTPATIENT_CLINIC_OR_DEPARTMENT_OTHER): Payer: Self-pay

## 2024-01-28 ENCOUNTER — Other Ambulatory Visit (HOSPITAL_COMMUNITY): Payer: Self-pay

## 2024-01-28 ENCOUNTER — Other Ambulatory Visit: Payer: Self-pay | Admitting: Medical

## 2024-01-28 ENCOUNTER — Encounter (HOSPITAL_COMMUNITY): Payer: Self-pay

## 2024-01-28 ENCOUNTER — Other Ambulatory Visit: Payer: Self-pay

## 2024-01-28 ENCOUNTER — Telehealth (HOSPITAL_COMMUNITY): Payer: Self-pay

## 2024-01-28 ENCOUNTER — Encounter: Payer: Self-pay | Admitting: Hematology and Oncology

## 2024-01-28 MED ORDER — METHYLPHENIDATE HCL ER (CD) 20 MG PO CPCR
20.0000 mg | ORAL_CAPSULE | ORAL | 0 refills | Status: AC
  Filled 2024-01-28: qty 30, 30d supply, fill #0

## 2024-01-28 MED ORDER — METHYLPHENIDATE HCL ER 18 MG PO TB24
18.0000 mg | ORAL_TABLET | Freq: Every day | ORAL | 0 refills | Status: DC
  Filled 2024-01-28 (×2): qty 30, 30d supply, fill #0

## 2024-01-28 NOTE — Telephone Encounter (Signed)
   What is the difference in the ER 18mg  and 20mg  you sent in. Please notified patient. ER 18mg  was not covered due to patient not having ADHD or ADD. I am not sure the 20mg  will be covered either.

## 2024-01-28 NOTE — Telephone Encounter (Signed)
 Pharmacy Patient Advocate Encounter   Received notification from Pt Calls Messages that prior authorization for Methylphenidate  20mg  ER caps is required/requested.   Insurance verification completed.   The patient is insured through [facility]   Per test claim: PA required; PA submitted to above mentioned insurance via Latent Key/confirmation #/EOC [facility] Status is pending

## 2024-01-28 NOTE — Telephone Encounter (Signed)
 Pharmacy Patient Advocate Encounter  Received notification from [facility] that Prior Authorization for Methylphenidate  20mg  ER caps has been DENIED.  See denial reason below. No denial letter attached in CMM. Will attach denial letter to Media tab once received.   PA #/Case ID/Reference #: [id number]

## 2024-01-28 NOTE — Addendum Note (Signed)
 Addended by: BULAH ALM RAMAN on: 01/28/2024 12:46 PM   Modules accepted: Orders

## 2024-02-25 ENCOUNTER — Ambulatory Visit: Admitting: Nurse Practitioner

## 2024-10-18 ENCOUNTER — Encounter: Payer: Self-pay | Admitting: Medical
